# Patient Record
Sex: Male | Born: 1990 | Race: White | Hispanic: No | Smoking: Never smoker
Health system: Southern US, Community
[De-identification: ages and names within clinical notes are randomized; demographics above are authoritative.]

## PROBLEM LIST (undated history)

## (undated) DIAGNOSIS — I341 Nonrheumatic mitral (valve) prolapse: Secondary | ICD-10-CM

## (undated) DIAGNOSIS — J309 Allergic rhinitis, unspecified: Secondary | ICD-10-CM

## (undated) DIAGNOSIS — Z8659 Personal history of other mental and behavioral disorders: Secondary | ICD-10-CM

## (undated) HISTORY — DX: Allergic rhinitis, unspecified: J30.9

## (undated) HISTORY — DX: Personal history of other mental and behavioral disorders: Z86.59

## (undated) HISTORY — DX: Nonrheumatic mitral (valve) prolapse: I34.1

---
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
## 2013-09-28 ENCOUNTER — Emergency Department: Payer: Self-pay | Admitting: Emergency Medicine

## 2014-07-31 ENCOUNTER — Ambulatory Visit: Payer: Self-pay | Admitting: Family Medicine

## 2014-10-01 ENCOUNTER — Encounter (HOSPITAL_COMMUNITY): Payer: Self-pay | Admitting: *Deleted

## 2014-10-01 DIAGNOSIS — M542 Cervicalgia: Secondary | ICD-10-CM | POA: Diagnosis not present

## 2014-10-01 DIAGNOSIS — J9801 Acute bronchospasm: Secondary | ICD-10-CM | POA: Diagnosis not present

## 2014-10-01 DIAGNOSIS — J40 Bronchitis, not specified as acute or chronic: Secondary | ICD-10-CM | POA: Insufficient documentation

## 2014-10-01 DIAGNOSIS — R51 Headache: Secondary | ICD-10-CM | POA: Insufficient documentation

## 2014-10-01 NOTE — ED Notes (Signed)
Pt in c/o headache, cough and congestion, body aches and fatigue, also sore throat, symptoms x1 week

## 2014-10-02 ENCOUNTER — Emergency Department (HOSPITAL_COMMUNITY)
Admission: EM | Admit: 2014-10-02 | Discharge: 2014-10-02 | Disposition: A | Discharge status: Home / Self Care | Payer: BLUE CROSS/BLUE SHIELD | Attending: Emergency Medicine | Admitting: Emergency Medicine

## 2014-10-02 ENCOUNTER — Emergency Department (HOSPITAL_COMMUNITY): Payer: BLUE CROSS/BLUE SHIELD

## 2014-10-02 DIAGNOSIS — R05 Cough: Secondary | ICD-10-CM

## 2014-10-02 DIAGNOSIS — R51 Headache: Secondary | ICD-10-CM

## 2014-10-02 DIAGNOSIS — R519 Headache, unspecified: Secondary | ICD-10-CM

## 2014-10-02 DIAGNOSIS — J209 Acute bronchitis, unspecified: Secondary | ICD-10-CM

## 2014-10-02 DIAGNOSIS — R059 Cough, unspecified: Secondary | ICD-10-CM

## 2014-10-02 MED ORDER — IBUPROFEN 600 MG PO TABS: 600.0000 mg | ORAL_TABLET | Freq: Four times a day (QID) | ORAL | Status: DC | PRN

## 2014-10-02 MED ORDER — PREDNISONE 20 MG PO TABS: ORAL_TABLET | ORAL | Status: DC

## 2014-10-02 MED ORDER — IBUPROFEN 200 MG PO TABS
600.0000 mg | ORAL_TABLET | Freq: Once | ORAL | Status: AC
  Administered 2014-10-02: 600 mg via ORAL
  Filled 2014-10-02: qty 3

## 2014-10-02 MED ORDER — PREDNISONE 20 MG PO TABS
40.0000 mg | ORAL_TABLET | Freq: Once | ORAL | Status: AC
  Administered 2014-10-02: 40 mg via ORAL
  Filled 2014-10-02: qty 2

## 2014-10-02 MED ORDER — ALBUTEROL SULFATE HFA 108 (90 BASE) MCG/ACT IN AERS
1.0000 | INHALATION_SPRAY | RESPIRATORY_TRACT | Status: DC | PRN
  Administered 2014-10-02: 2 via RESPIRATORY_TRACT
  Filled 2014-10-02: qty 6.7

## 2014-10-02 MED ORDER — ALBUTEROL SULFATE (2.5 MG/3ML) 0.083% IN NEBU
5.0000 mg | INHALATION_SOLUTION | Freq: Once | RESPIRATORY_TRACT | Status: AC
  Administered 2014-10-02: 5 mg via RESPIRATORY_TRACT
  Filled 2014-10-02: qty 6

## 2014-10-02 NOTE — Discharge Instructions (Signed)
Bronchospasm A bronchospasm is a spasm or tightening of the airways going into the lungs. During a bronchospasm breathing becomes more difficult because the airways get smaller. When this happens there can be coughing, a whistling sound when breathing (wheezing), and difficulty breathing. Bronchospasm is often associated with asthma, but not all patients who experience a bronchospasm have asthma. CAUSES  A bronchospasm is caused by inflammation or irritation of the airways. The inflammation or irritation may be triggered by:   Allergies (such as to animals, pollen, food, or mold). Allergens that cause bronchospasm may cause wheezing immediately after exposure or many hours later.   Infection. Viral infections are believed to be the most common cause of bronchospasm.   Exercise.   Irritants (such as pollution, cigarette smoke, strong odors, aerosol sprays, and paint fumes).   Weather changes. Winds increase molds and pollens in the air. Rain refreshes the air by washing irritants out. Cold air may cause inflammation.   Stress and emotional upset.  SIGNS AND SYMPTOMS   Wheezing.   Excessive nighttime coughing.   Frequent or severe coughing with a simple cold.   Chest tightness.   Shortness of breath.  DIAGNOSIS  Bronchospasm is usually diagnosed through a history and physical exam. Tests, such as chest X-rays, are sometimes done to look for other conditions. TREATMENT   Inhaled medicines can be given to open up your airways and help you breathe. The medicines can be given using either an inhaler or a nebulizer machine.  Corticosteroid medicines may be given for severe bronchospasm, usually when it is associated with asthma. HOME CARE INSTRUCTIONS   Always have a plan prepared for seeking medical care. Know when to call your health care provider and local emergency services (911 in the [place] Know where you can access local emergency care.  Only take medicines as  directed by your health care provider.  If you were prescribed an inhaler or nebulizer machine, ask your health care provider to explain how to use it correctly. Always use a spacer with your inhaler if you were given one.  It is necessary to remain calm during an attack. Try to relax and breathe more slowly.  Control your home environment in the following ways:   Change your heating and air conditioning filter at least once a month.   Limit your use of fireplaces and wood stoves.  Do not smoke and do not allow smoking in your home.   Avoid exposure to perfumes and fragrances.   Get rid of pests (such as roaches and mice) and their droppings.   Throw away plants if you see mold on them.   Keep your house clean and dust free.   Replace carpet with wood, tile, or vinyl flooring. Carpet can trap dander and dust.   Use allergy-proof pillows, mattress covers, and box spring covers.   Wash bed sheets and blankets every week in hot water and dry them in a dryer.   Use blankets that are made of polyester or cotton.   Wash hands frequently. SEEK MEDICAL CARE IF:   You have muscle aches.   You have chest pain.   The sputum changes from clear or white to yellow, green, gray, or bloody.   The sputum you cough up gets thicker.   There are problems that may be related to the medicine you are given, such as a rash, itching, swelling, or trouble breathing.  SEEK IMMEDIATE MEDICAL CARE IF:   You have worsening wheezing and coughing even   after taking your prescribed medicines.   You have increased difficulty breathing.   You develop severe chest pain. MAKE SURE YOU:   Understand these instructions.  Will watch your condition.  Will get help right away if you are not doing well or get worse. Document Released: 05/25/2003 Document Revised: 05/27/2013 Document Reviewed: 11/11/2012 [employer] Patient Information 2015 [employer]. This information is not  intended to replace advice given to you by your health care provider. Make sure you discuss any questions you have with your health care provider. Acute Bronchitis Bronchitis is inflammation of the airways that extend from the windpipe into the lungs (bronchi). The inflammation often causes mucus to develop. This leads to a cough, which is the most common symptom of bronchitis.  In acute bronchitis, the condition usually develops suddenly and goes away over time, usually in a couple weeks. Smoking, allergies, and asthma can make bronchitis worse. Repeated episodes of bronchitis may cause further lung problems.  CAUSES Acute bronchitis is most often caused by the same virus that causes a cold. The virus can spread from person to person (contagious) through coughing, sneezing, and touching contaminated objects. SIGNS AND SYMPTOMS   Cough.   Fever.   Coughing up mucus.   Body aches.   Chest congestion.   Chills.   Shortness of breath.   Sore throat.  DIAGNOSIS  Acute bronchitis is usually diagnosed through a physical exam. Your health care provider will also ask you questions about your medical history. Tests, such as chest X-rays, are sometimes done to rule out other conditions.  TREATMENT  Acute bronchitis usually goes away in a couple weeks. Oftentimes, no medical treatment is necessary. Medicines are sometimes given for relief of fever or cough. Antibiotic medicines are usually not needed but may be prescribed in certain situations. In some cases, an inhaler may be recommended to help reduce shortness of breath and control the cough. A cool mist vaporizer may also be used to help thin bronchial secretions and make it easier to clear the chest.  HOME CARE INSTRUCTIONS  Get plenty of rest.   Drink enough fluids to keep your urine clear or pale yellow (unless you have a medical condition that requires fluid restriction). Increasing fluids may help thin your respiratory  secretions (sputum) and reduce chest congestion, and it will prevent dehydration.   Take medicines only as directed by your health care provider.  If you were prescribed an antibiotic medicine, finish it all even if you start to feel better.  Avoid smoking and secondhand smoke. Exposure to cigarette smoke or irritating chemicals will make bronchitis worse. If you are a smoker, consider using nicotine gum or skin patches to help control withdrawal symptoms. Quitting smoking will help your lungs heal faster.   Reduce the chances of another bout of acute bronchitis by washing your hands frequently, avoiding people with cold symptoms, and trying not to touch your hands to your mouth, nose, or eyes.   Keep all follow-up visits as directed by your health care provider.  SEEK MEDICAL CARE IF: Your symptoms do not improve after 1 week of treatment.  SEEK IMMEDIATE MEDICAL CARE IF:  You develop an increased fever or chills.   You have chest pain.   You have severe shortness of breath.  You have bloody sputum.   You develop dehydration.  You faint or repeatedly feel like you are going to pass out.  You develop repeated vomiting.  You develop a severe headache. MAKE SURE YOU:     Understand these instructions.  Will watch your condition.  Will get help right away if you are not doing well or get worse. Document Released: 06/29/2004 Document Revised: 10/06/2013 Document Reviewed: 11/12/2012 Exit[employer] Patient Information 2015 [employer]. This information is not intended to replace advice given to you by your health care provider. Make sure you discuss any questions you have with your health care provider.  

## 2014-10-02 NOTE — ED Notes (Signed)
Patient transported to X-ray 

## 2014-10-02 NOTE — ED Provider Notes (Signed)
CSN: [id number]     Arrival date & time 10/01/14  2308 History   First MD Initiated Contact with Patient 10/02/14 0026     This chart was scribed for No att. providers found by Arlan Organ, ED Scribe. This patient was seen in room A02C/A02C and the patient's care was started 12:30 AM.   Chief Complaint  Patient presents with  . Headache   HPI  HPI Comments: Franklin Curry is a 24 y.o. male with a PMHx of seasonal allergies who presents to the Emergency Department complaining of intermittent, ongoing, unchanged HA x 1 week. Pt states HA is located behind is eyes bilaterally and reports pressure that runs down the back on his head. He also reports nonproductive cough, nasal congestion, diffuse myalgias, fatigue, and sore throat. No OTC medications or home remedies attempted prior to arrival. No recent fever or chills. No known sick contacts. No known allergies to medications.  History reviewed. No pertinent past medical history. History reviewed. No pertinent past surgical history. History reviewed. No pertinent family history. History  Substance Use Topics  . Smoking status: Never Smoker   . Smokeless tobacco: Not on file  . Alcohol Use: Not on file    Review of Systems  Constitutional: Positive for fatigue. Negative for fever and chills.  HENT: Positive for congestion, nosebleeds, sinus pressure and sore throat. Negative for ear discharge, ear pain and trouble swallowing.   Respiratory: Positive for cough. Negative for shortness of breath and wheezing.   Cardiovascular: Negative for chest pain.  Gastrointestinal: Negative for nausea, vomiting and abdominal pain.  Musculoskeletal: Positive for myalgias and neck pain. Negative for back pain and neck stiffness.  Skin: Negative for rash and wound.  Neurological: Positive for headaches. Negative for syncope, weakness and numbness.  Psychiatric/Behavioral: Negative for confusion.  All other systems reviewed and are  negative.     Allergies  Review of patient's allergies indicates no known allergies.  Home Medications   Prior to Admission medications   Medication Sig Start Date End Date Taking? Authorizing Provider  ibuprofen (ADVIL,MOTRIN) 600 MG tablet Take 1 tablet (600 mg total) by mouth every 6 (six) hours as needed. 10/02/14   Loren Racer, MD  predniSONE (DELTASONE) 20 MG tablet 3 tabs po day one, then 2 tabs daily x 4 days 10/02/14   Loren Racer, MD   Triage Vitals: BP 113/58 mmHg  Pulse 78  Temp(Src) 98 F (36.7 C) (Oral)  Resp 18  Ht  (1.727 m)  Wt 125 lb (56.7 kg)  BMI 19.01 kg/m2  SpO2 100%   Physical Exam  Constitutional: He is oriented to person, place, and time. He appears well-developed and well-nourished. No distress.  HENT:  Head: Normocephalic and atraumatic.  Mouth/Throat: Oropharynx is clear and moist. No oropharyngeal exudate.  Oropharynx mildly erythematous. No tonsillar exudates. Uvula is midline. Bilateral nasal mucosal edema. No sinus tenderness with percussion. Small mobile occipital lymph node palpated.  Eyes: EOM are normal. Pupils are equal, round, and reactive to light. Right eye exhibits no discharge. Left eye exhibits no discharge.  Neck: Normal range of motion. Neck supple.  No meningismus  Cardiovascular: Normal rate and regular rhythm.  Exam reveals no gallop and no friction rub.   No murmur heard. Pulmonary/Chest: Effort normal. No respiratory distress. He has no wheezes. He has no rales.  Decreased breath sounds bilateral bases.  Abdominal: Soft. Bowel sounds are normal. He exhibits no distension and no mass. There is no tenderness. There is no  rebound and no guarding.  Musculoskeletal: Normal range of motion. He exhibits no edema or tenderness.  No calf swelling or tenderness. Distal pulses intact.  Lymphadenopathy:    He has no cervical adenopathy.  Neurological: He is alert and oriented to person, place, and time.  Moves all  extremities without deficit. Sensation is grossly intact.  Skin: Skin is warm and dry. No rash noted. No erythema.  Psychiatric: He has a normal mood and affect. His behavior is normal.  Nursing note and vitals reviewed.   ED Course  Procedures (including critical care time)  DIAGNOSTIC STUDIES: Oxygen Saturation is 99% on RA, Normal by my interpretation.    COORDINATION OF CARE: 12:33 AM-Discussed treatment plan with pt at bedside and pt agreed to plan.     Labs Review Labs Reviewed - No data to display  Imaging Review Dg Chest 2 View  10/02/2014   CLINICAL DATA:  Acute onset of cough, shortness of breath and chest pain. Initial encounter.  EXAM: CHEST  2 VIEW  COMPARISON:  None.  FINDINGS: The lungs are well-aerated. Mild peribronchial thickening is noted. There is no evidence of focal opacification, pleural effusion or pneumothorax.  The heart is normal in size; the mediastinal contour is within normal limits. No acute osseous abnormalities are seen.  IMPRESSION: Mild peribronchial thickening noted; lungs otherwise clear.   Electronically Signed   By: Roanna RaiderJeffery  Chang M.D.   On: 10/02/2014 01:45     EKG Interpretation None      MDM   Final diagnoses:  Cough  Bronchitis with bronchospasm  Nonintractable headache, unspecified chronicity pattern, unspecified headache type    I personally performed the services described in this documentation, which was scribed in my presence. The recorded information has been reviewed and is accurate.  Patient's much better after albuterol treatment. Bronchitic changes on x-ray. Patient a normal neurologic exam. Do not suspect intracranial pathology. Likely bronchitis with bronchospasm. Headache may be related to sinus pressure and cough. Advised to continue Allegra and nasal sprays. Return precautions have been given.  Loren Raceravid Trea Carnegie, MD 10/02/14 [phone]

## 2014-11-06 ENCOUNTER — Ambulatory Visit: Payer: Self-pay | Admitting: Family Medicine

## 2015-02-26 ENCOUNTER — Encounter: Payer: Self-pay | Admitting: Family Medicine

## 2015-02-26 ENCOUNTER — Encounter (INDEPENDENT_AMBULATORY_CARE_PROVIDER_SITE_OTHER): Payer: Self-pay

## 2015-02-26 ENCOUNTER — Ambulatory Visit (INDEPENDENT_AMBULATORY_CARE_PROVIDER_SITE_OTHER): Payer: BLUE CROSS/BLUE SHIELD | Admitting: Family Medicine

## 2015-02-26 VITALS — BP 100/60 | HR 68 | Temp 97.0°F | Resp 16 | Ht 68.0 in | Wt 122.0 lb

## 2015-02-26 DIAGNOSIS — Z0000 Encounter for general adult medical examination without abnormal findings: Secondary | ICD-10-CM | POA: Diagnosis not present

## 2015-02-26 DIAGNOSIS — Z23 Encounter for immunization: Secondary | ICD-10-CM | POA: Diagnosis not present

## 2015-02-26 DIAGNOSIS — B079 Viral wart, unspecified: Secondary | ICD-10-CM

## 2015-02-26 DIAGNOSIS — B078 Other viral warts: Secondary | ICD-10-CM | POA: Insufficient documentation

## 2015-02-26 NOTE — Progress Notes (Signed)
Pre visit review using our clinic review tool, if applicable. No additional management support is needed unless otherwise documented below in the visit note. 

## 2015-02-26 NOTE — Patient Instructions (Addendum)
Flu shot today We will freeze warts on hand. For one near nail fold - treat with over the counter salicylic acid pad (like clear away wart remover). Follow instructions on packet. Nice to meet you today, return as needed or in 1 year for next physical.   Health Maintenance - 97-87 Years [place] After high school, you may attend college or technical or vocational school, enroll in the [employer], or enter the workforce. PHYSICAL, SOCIAL, AND EMOTIONAL DEVELOPMENT  One hour of regular physical activity daily is recommended. Continue to participate in sports.  Develop your own interests and consider community service or volunteerism.  Make decisions about college and work plans.  Throughout these years, you should assume responsibility for your own health care. Increasing independence is important for you.  You may be exploring your sexual identity. Understand that you should never be in a situation that makes you feel uncomfortable, and tell your partner if you do not want to engage in sexual activity.  Body image may become important to you. Be mindful that eating disorders can develop at this time. Talk to your parents or other caregivers if you have concerns about body image, weight gain, or losing weight.  You may notice mood disturbances, depression, anxiety, attention problems, or trouble with alcohol. Talk to your health care provider if you have concerns about mental illness.  Set limits for yourself and talk with your parents or other caregivers about independent decision making.  Handle conflict without physical violence.  Avoid loud noises which may impair hearing.  Limit television and computer time to 2 hours each day. Individuals who engage in excessive inactivity are more likely to become overweight. RECOMMENDED IMMUNIZATIONS  Influenza vaccine.  All adults should be immunized every year.  All adults, including pregnant women and people with hives-only  allergy to eggs, can receive the inactivated influenza (IIV) vaccine.  Adults aged 18-49 years can receive the recombinant influenza (RIV) vaccine. The RIV vaccine does not contain any egg protein.  Tetanus, diphtheria, and acellular pertussis (Td, Tdap) vaccine.  Pregnant women should receive 1 dose of Tdap vaccine during each pregnancy. The dose should be obtained regardless of the length of time since the last dose. Immunization is preferred during the 27th to 36th week of gestation.  An adult who has not previously received Tdap or who does not know his or her vaccine status should receive 1 dose of Tdap. This initial dose should be followed by tetanus and diphtheria toxoids (Td) booster doses every 10 years.  Adults with an unknown or incomplete history of completing a 3-dose immunization series with Td-containing vaccines should begin or complete a primary immunization series including a Tdap dose.  Adults should receive a Td booster every 10 years.  Varicella vaccine.  An adult without evidence of immunity to varicella should receive 2 doses or a second dose if he or she has previously received 1 dose.  Pregnant females who do not have evidence of immunity should receive the first dose after pregnancy. This first dose should be obtained before leaving the health care facility. The second dose should be obtained 4-8 weeks after the first dose.  Human papillomavirus (HPV) vaccine.  Females aged 13-26 years who have not received the vaccine previously should obtain the 3-dose series.  The vaccine is not recommended for pregnant females. However, pregnancy testing is not needed before receiving a dose. If a male is found to be pregnant after receiving a dose, no treatment is needed.  In that case, the remaining doses should be delayed until after the pregnancy.  Males aged 29-21 years who have not received the vaccine previously should receive the 3-dose series. Males aged 22-26 years  may be immunized.  Immunization is recommended through the age of 71 years for any male who has sex with males and did not get any or all doses earlier.  Immunization is recommended for any person with an immunocompromised condition through the age of 51 years if he or she did not get any or all doses earlier.  During the 3-dose series, the second dose should be obtained 4-8 weeks after the first dose. The third dose should be obtained 24 weeks after the first dose and 16 weeks after the second dose.  Measles, mumps, and rubella (MMR) vaccine.  Adults born in [birth year] or later should have 1 or more doses of MMR vaccine unless there is a contraindication to the vaccine or there is laboratory evidence of immunity to each of the three diseases.  A routine second dose of MMR vaccine should be obtained at least 28 days after the first dose for students attending postsecondary schools, health care workers, and international travelers.  For females of childbearing age, rubella immunity should be determined. If there is no evidence of immunity, females who are not pregnant should be vaccinated. If there is no evidence of immunity, females who are pregnant should delay immunization until after pregnancy.  Pneumococcal 13-valent conjugate (PCV13) vaccine.  When indicated, a person who is uncertain of his or her immunization history and has no record of immunization should receive the PCV13 vaccine.  An adult aged 49 years or older who has certain medical conditions and has not been previously immunized should receive 1 dose of PCV13 vaccine. This PCV13 should be followed with a dose of pneumococcal polysaccharide (PPSV23) vaccine. The PPSV23 vaccine dose should be obtained at least 8 weeks after the dose of PCV13 vaccine.  An adult aged 30 years or older who has certain medical conditions and previously received 1 or more doses of PPSV23 vaccine should receive 1 dose of PCV13. The PCV13 vaccine dose should  be obtained 1 or more years after the last PPSV23 vaccine dose.  Pneumococcal polysaccharide (PPSV23) vaccine.  When PCV13 is also indicated, PCV13 should be obtained first.  An adult younger than age 81 years who has certain medical conditions should be immunized.  Any person who resides in a long-term care facility should be immunized.  An adult smoker should be immunized.  People with an immunocompromised condition and certain other conditions should receive both PCV13 and PPSV23 vaccines.  People with human immunodeficiency virus (HIV) infection should be immunized as soon as possible after diagnosis.  Immunization during chemotherapy or radiation therapy should be avoided.  Routine use of PPSV23 vaccine is not recommended for American Indians, [place] Natives, or people younger than 65 years unless there are medical conditions that require PPSV23 vaccine.  When indicated, people who have unknown immunization and have no record of immunization should receive PPSV23 vaccine.  One-time revaccination 5 years after the first dose of PPSV23 is recommended for people aged 19-64 years who have chronic kidney failure, nephrotic syndrome, asplenia, or immunocompromised conditions.  Meningococcal vaccine.  Adults with asplenia or persistent complement component deficiencies should receive 2 doses of quadrivalent meningococcal conjugate (MenACWY-D) vaccine. The doses should be obtained at least 2 months apart.  Microbiologists working with certain meningococcal bacteria, [employer] recruits, people at risk during an outbreak,  and people who travel to or live in countries with a high rate of meningitis should be immunized.  A first-year college student up through age 71 years who is living in a residence hall should receive a dose if he or she did not receive a dose on or after his or her 16th birthday.  Adults who have certain high-risk conditions should receive one or more doses of  vaccine.  Hepatitis A vaccine.  Adults who wish to be protected from this disease, have certain high-risk conditions, work with hepatitis A-infected animals, work in hepatitis A research labs, or travel to or work in countries with a high rate of hepatitis A should be immunized.  Adults who were previously unvaccinated and who anticipate close contact with an international adoptee during the first 60 days after arrival in the [place] from a country with a high rate of hepatitis A should be immunized.  Hepatitis B vaccine.  Adults who wish to be protected from this disease, have certain high-risk conditions, may be exposed to blood or other infectious body fluids, are household contacts or sex partners of hepatitis B positive people, are clients or workers in certain care facilities, or travel to or work in countries with a high rate of hepatitis B should be immunized.  Haemophilus influenzae type b (Hib) vaccine.  A previously unvaccinated person with asplenia or sickle cell disease or having a scheduled splenectomy should receive 1 dose of Hib vaccine.  Regardless of previous immunization, a recipient of a hematopoietic stem cell transplant should receive a 3-dose series 6-12 months after his or her successful transplant.  Hib vaccine is not recommended for adults with HIV infection. TESTING  Annual screening for vision and hearing problems is recommended. Vision should be screened at least once between 33-38 years of age.  You may be screened for anemia or tuberculosis.  You should have a blood test to check for high cholesterol.  You should be screened for alcohol and drug use.  If you are sexually active, you may be screened for sexually transmitted infections (STIs), pregnancy, or HIV. You should be screened for STIs if:  Your sexual activity has changed since the last screening test, and you are at an increased risk for chlamydia or gonorrhea. Ask your health care provider  if you are at risk.  If you are at an increased risk for hepatitis B, you should be screened for this virus. You are considered at high risk for hepatitis B if you:  Were born in a country where hepatitis B occurs often. Talk with your health care provider about which countries are considered high risk.  Have parents who were born in a high-risk country and have not received a shot to protect against hepatitis B (hepatitis B vaccine).  Have HIV or AIDS.  Use needles to inject street drugs.  Live with or have sex with someone who has hepatitis B.  Are a man who has sex with other men (MSM).  Get hemodialysis treatment.  Take certain medicines for conditions like cancer, organ transplantation, or autoimmune conditions. NUTRITION   You should:  Have three servings of low-fat milk and dairy products daily. If you do not drink milk or consume dairy products, you should eat calcium-enriched foods, such as juice, bread, or cereal. Dark, leafy greens or canned fish are alternate sources of calcium.  Drink plenty of water. Fruit juice should be limited to 8-12 oz (240-360 mL) each day. Sugary beverages and sodas should be  avoided.  Avoid eating foods high in fat, salt, or sugar, such as chips, candy, and cookies.  Avoid fast foods and limit eating out at restaurants.  Try not to skip meals, especially breakfast. You should eat a variety of vegetables, fruits, and lean meats.  Eat meals together as a family whenever possible. ORAL HEALTH Brush your teeth twice a day and floss at least once a day. You should have two dental exams a year.  SKIN CARE You should wear sunscreen when out in the sun. TALK TO SOMEONE ABOUT:  Precautions against pregnancy, contraception, and sexually transmitted infections.  Taking a prescription medicine daily to prevent HIV infection if you are at risk of being infected with HIV. This is called preexposure prophylaxis (PrEP). You are at risk if you:  Are a  male who has sex with other males (MSM).  Are heterosexual and sexually active with more than one partner.  Take drugs by injection.  Are sexually active with a partner who has HIV.  Whether you are at high risk of being infected with HIV. If you choose to begin PrEP, you should first be tested for HIV. You should then be tested every 3 months for as long as you are taking PrEP.  Drug, tobacco, and alcohol use among your friends or at friends' homes. Smoking tobacco or marijuana and taking drugs have health consequences and may impact your brain development.  Appropriate use of over-the-counter or prescription medicines.  Driving guidelines and riding with friends.  The risks of drinking and driving or boating. Call someone if you have been drinking or using drugs and need a ride. WHAT'S NEXT? Visit your pediatrician or family physician once a year. By young adulthood, you should transition from your pediatrician to a family physician or internal medicine specialist. If you are a male and are sexually active, you may want to begin annual physical exams with a gynecologist. Document Released: 08/17/2006 Document Revised: 05/27/2013 Document Reviewed: 09/06/2006 [facility] Patient Information 2015 [place], [place]. This information is not intended to replace advice given to you by your health care provider. Make sure you discuss any questions you have with your health care provider.

## 2015-02-26 NOTE — Progress Notes (Signed)
BP 100/60 mmHg  Pulse 68  Temp(Src) 97 F (36.1 C)  Resp 16  Ht  (1.727 m)  Wt 122 lb (55.339 kg)  BMI 18.55 kg/m2  SpO2 97%   CC: new pt to establish  Subjective:    Patient ID: Franklin Curry, male    DOB: [birth year], 24 y.o.   MRN: [id number]  HPI: Franklin Curry is a 24 y.o. male presenting on 02/26/2015 for New Patient (Initial Visit)   Would like warts on hand evaluated. Started as callus. Ongoing for 6-7 months.   Sexually active - 1 partner in last year. Condoms 100%. No STD concerns.  Preventative: Flu shot today Tetanus shot - 12/2007 Seat belt use discussed.  Sunscreen use discussed. No changing moles on skin.   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 Interim medical history since our last visit reviewed. Allergies and medications reviewed and updated. No current outpatient prescriptions on file prior to visit.   No current facility-administered medications on file prior to visit.    Review of Systems  Constitutional: Negative for fever, chills, activity change, appetite change, fatigue and unexpected weight change.  HENT: Negative for hearing loss.   Eyes: Negative for visual disturbance.  Respiratory: Negative for cough, chest tightness, shortness of breath and wheezing.   Cardiovascular: Negative for chest pain, palpitations and leg swelling.  Gastrointestinal: Negative for nausea, vomiting, abdominal pain, diarrhea, constipation, blood in stool and abdominal distention.  Genitourinary: Negative for hematuria and difficulty urinating.  Musculoskeletal: Negative for myalgias, arthralgias and neck pain.  Skin: Negative for rash.  Neurological: Negative for dizziness, seizures, syncope and headaches.    Hematological: Negative for adenopathy. Does not bruise/bleed easily.  Psychiatric/Behavioral: Negative for dysphoric mood. The patient is not nervous/anxious.    Per HPI unless specifically indicated above     Objective:    BP 100/60 mmHg  Pulse 68  Temp(Src) 97 F (36.1 C)  Resp 16  Ht  (1.727 m)  Wt 122 lb (55.339 kg)  BMI 18.55 kg/m2  SpO2 97%  Wt Readings from Last 3 Encounters:  02/26/15 122 lb (55.339 kg)  10/01/14 125 lb (56.7 kg)    Physical Exam  Constitutional: He is oriented to person, place, and time. He appears well-developed and well-nourished. No distress.  HENT:  Head: Normocephalic and atraumatic.  Right Ear: Hearing, tympanic membrane, external ear and ear canal normal.  Left Ear: Hearing, tympanic membrane, external ear and ear canal normal.  Nose: Nose normal.  Mouth/Throat: Uvula is midline, oropharynx is clear and moist and mucous membranes are normal. No oropharyngeal exudate, posterior oropharyngeal edema or posterior oropharyngeal erythema.  Eyes: Conjunctivae and EOM are normal. Pupils are equal, round, and reactive to light. No scleral icterus.  Neck: Normal range of motion. Neck supple. No thyromegaly present.  Cardiovascular: Normal rate, regular rhythm, normal heart sounds and intact distal pulses.   No murmur heard. Pulses:      Radial pulses are 2+ on the right side, and 2+ on the left side.  Pulmonary/Chest: Effort normal and breath sounds normal. No respiratory distress. He has no wheezes. He has no rales.  Abdominal: Soft. Bowel sounds are normal. He exhibits no distension and no mass. There is no tenderness. There is no rebound and no guarding.  Musculoskeletal:  Normal range of motion. He exhibits no edema.  Lymphadenopathy:    He has no cervical adenopathy.  Neurological: He is alert and oriented to person, place, and time.  CN grossly intact, station and gait intact  Skin: Skin is warm and dry. No rash noted.  Verrucous growths  on R thumb (one on medial nail fold)  Psychiatric: He has a normal mood and affect. His behavior is normal. Judgment and thought content normal.  Nursing note and vitals reviewed.  Cryotherapy: Liquid nitrogen was applied for 10-12 seconds to the skin lesion and the expected blistering or scabbing reaction explained. Do not pick at the area. Return if lesion fails to fully resolve.  No results found for this or any previous visit.    Assessment & Plan:   Problem List Items Addressed This Visit    Verruca vulgaris    Treat with LN2 to 2 warts on thumb fold. Discussed expected course of healing Discussed OTC salicylic acid treatment.  Update if not improving with treatment - discussed possibly needing to return for rpt treatment.      Health maintenance examination - Primary    Preventative protocols reviewed and updated unless pt declined. Discussed healthy diet and lifestyle.           Follow up plan: Return in about 1 year (around 02/26/2016), or as needed, for annual exam, prior fasting for blood work.

## 2015-02-26 NOTE — Assessment & Plan Note (Signed)
Preventative protocols reviewed and updated unless pt declined. Discussed healthy diet and lifestyle.  

## 2015-02-26 NOTE — Assessment & Plan Note (Signed)
Treat with LN2 to 2 warts on thumb fold. Discussed expected course of healing Discussed OTC salicylic acid treatment.  Update if not improving with treatment - discussed possibly needing to return for rpt treatment.

## 2015-03-06 ENCOUNTER — Encounter: Payer: Self-pay | Admitting: Family Medicine

## 2015-03-08 ENCOUNTER — Encounter: Payer: Self-pay | Admitting: Family Medicine

## 2015-06-14 ENCOUNTER — Ambulatory Visit (INDEPENDENT_AMBULATORY_CARE_PROVIDER_SITE_OTHER): Payer: No Typology Code available for payment source | Admitting: Internal Medicine

## 2015-06-14 ENCOUNTER — Encounter: Payer: Self-pay | Admitting: Internal Medicine

## 2015-06-14 VITALS — BP 110/66 | HR 86 | Temp 99.0°F | Wt 125.0 lb

## 2015-06-14 DIAGNOSIS — J069 Acute upper respiratory infection, unspecified: Secondary | ICD-10-CM | POA: Diagnosis not present

## 2015-06-14 MED ORDER — AZITHROMYCIN 250 MG PO TABS: ORAL_TABLET | ORAL | Status: DC

## 2015-06-14 NOTE — Progress Notes (Signed)
HPI  Pt presents to the clinic today with c/o nasal congestion, ear fullness, sore throat and cough. This started 1 week ago. He is blowing green mucous out of his nose. The cough is productive of green mucous. He denies fever, but has had chills and body aches. He denies shortness of breath. He has tried Benadryl and Robitussin with some relief. He has a history of seasonal allergies. He has had sick contacts. He does not smoke.  Review of Systems      Past Medical History  Diagnosis Date  . Allergic rhinitis   . History of attention deficit disorder     prior on adderall and vyvanse, now off all meds; neuro eval from 2004 scanned into chart  . MVP (mitral valve prolapse) 2006    minor    Family History  Problem Relation Age of Onset  . Arthritis Maternal Grandmother   . Arthritis Maternal Grandfather   . Diabetes Maternal Grandfather   . Cancer Other     both grandmothers, unsure type  . CAD Neg Hx   . Stroke Neg Hx     Social History   Social History  . Marital Status: Unknown    Spouse Name: N/A  . Number of Children: N/A  . Years of Education: N/A   Occupational History  . Not on file.   Social History Main Topics  . Smoking status: Never Smoker   . Smokeless tobacco: Current User    Types: Snuff     Comment: 1 can/wk  . Alcohol Use: 0.0 oz/week    0 Standard drinks or equivalent per week     Comment: occasional  . Drug Use: No  . Sexual Activity: Not on file   Other Topics Concern  . Not on file   Social History Narrative   Lives with parents, and in firehouse   Occupation: Passenger transport manager at [employer], Engineer, water   Edu: HS   Activity: active at work   Diet: good water, no fruits/vegetables     No Known Allergies   Constitutional:  Denies headache, fatigue, fever or abrupt weight changes.  HEENT:  Positive nasal congestion, ear pain and  sore throat. Denies eye redness, eye pain, pressure behind the eyes, facial pain,  ringing in the ears, wax buildup, runny nose or bloody nose. Respiratory: Positive cough. Denies difficulty breathing or shortness of breath.  Cardiovascular: Denies chest pain, chest tightness, palpitations or swelling in the hands or feet.   No other specific complaints in a complete review of systems (except as listed in HPI above).  Objective:   BP 110/66 mmHg  Pulse 86  Temp(Src) 99 F (37.2 C) (Oral)  Wt 125 lb (56.7 kg)  SpO2 98% Wt Readings from Last 3 Encounters:  06/14/15 125 lb (56.7 kg)  02/26/15 122 lb (55.339 kg)  10/01/14 125 lb (56.7 kg)     General: Appears his stated age, will appearing in NAD. HEENT: Head: normal shape and size, no sinus tenderness noted; Eyes: sclera white, no icterus, conjunctiva pink; Ears: Tm's pink but intact, normal light reflex; Nose: mucosa pink and moist, septum midline; Throat/Mouth: + PND. Teeth present, mucosa erythematous and moist, no exudate noted, no lesions or ulcerations noted.  Neck: Cervical lymphadenopathy noted.  Cardiovascular: Normal rate and rhythm. S1,S2 noted.  No murmur, rubs or gallops noted.  Pulmonary/Chest: Normal effort and positive vesicular breath sounds. No respiratory distress. No wheezes, rales or ronchi noted.      Assessment &  Plan:   Upper Respiratory Infection:  Get some rest and drink plenty of water Do salt water gargles for the sore throat eRx for Azithromax x 5 days Continue Robitussin for cough Work note provided  RTC as needed or if symptoms persist.

## 2015-06-14 NOTE — Progress Notes (Signed)
Pre visit review using our clinic review tool, if applicable. No additional management support is needed unless otherwise documented below in the visit note. 

## 2015-06-14 NOTE — Patient Instructions (Signed)
Upper Respiratory Infection, Adult Most upper respiratory infections (URIs) are a viral infection of the air passages leading to the lungs. A URI affects the nose, throat, and upper air passages. The most common type of URI is nasopharyngitis and is typically referred to as "the common cold." URIs run their course and usually go away on their own. Most of the time, a URI does not require medical attention, but sometimes a bacterial infection in the upper airways can follow a viral infection. This is called a secondary infection. Sinus and middle ear infections are common types of secondary upper respiratory infections. Bacterial pneumonia can also complicate a URI. A URI can worsen asthma and chronic obstructive pulmonary disease (COPD). Sometimes, these complications can require emergency medical care and may be life threatening.  CAUSES Almost all URIs are caused by viruses. A virus is a type of germ and can spread from one person to another.  RISKS FACTORS You may be at risk for a URI if:   You smoke.   You have chronic heart or lung disease.  You have a weakened defense (immune) system.   You are very young or very old.   You have nasal allergies or asthma.  You work in crowded or poorly ventilated areas.  You work in health care facilities or schools. SIGNS AND SYMPTOMS  Symptoms typically develop 2-3 days after you come in contact with a cold virus. Most viral URIs last 7-10 days. However, viral URIs from the influenza virus (flu virus) can last 14-18 days and are typically more severe. Symptoms may include:   Runny or stuffy (congested) nose.   Sneezing.   Cough.   Sore throat.   Headache.   Fatigue.   Fever.   Loss of appetite.   Pain in your forehead, behind your eyes, and over your cheekbones (sinus pain).  Muscle aches.  DIAGNOSIS  Your health care provider may diagnose a URI by:  Physical exam.  Tests to check that your symptoms are not due to  another condition such as:  Strep throat.  Sinusitis.  Pneumonia.  Asthma. TREATMENT  A URI goes away on its own with time. It cannot be cured with medicines, but medicines may be prescribed or recommended to relieve symptoms. Medicines may help:  Reduce your fever.  Reduce your cough.  Relieve nasal congestion. HOME CARE INSTRUCTIONS   Take medicines only as directed by your health care provider.   Gargle warm saltwater or take cough drops to comfort your throat as directed by your health care provider.  Use a warm mist humidifier or inhale steam from a shower to increase air moisture. This may make it easier to breathe.  Drink enough fluid to keep your urine clear or pale yellow.   Eat soups and other clear broths and maintain good nutrition.   Rest as needed.   Return to work when your temperature has returned to normal or as your health care provider advises. You may need to stay home longer to avoid infecting others. You can also use a face mask and careful hand washing to prevent spread of the virus.  Increase the usage of your inhaler if you have asthma.   Do not use any tobacco products, including cigarettes, chewing tobacco, or electronic cigarettes. If you need help quitting, ask your health care provider. PREVENTION  The best way to protect yourself from getting a cold is to practice good hygiene.   Avoid oral or hand contact with people with cold   symptoms.   Wash your hands often if contact occurs.  There is no clear evidence that vitamin C, vitamin E, echinacea, or exercise reduces the chance of developing a cold. However, it is always recommended to get plenty of rest, exercise, and practice good nutrition.  SEEK MEDICAL CARE IF:   You are getting worse rather than better.   Your symptoms are not controlled by medicine.   You have chills.  You have worsening shortness of breath.  You have brown or red mucus.  You have yellow or brown nasal  discharge.  You have pain in your face, especially when you bend forward.  You have a fever.  You have swollen neck glands.  You have pain while swallowing.  You have white areas in the back of your throat. SEEK IMMEDIATE MEDICAL CARE IF:   You have severe or persistent:  Headache.  Ear pain.  Sinus pain.  Chest pain.  You have chronic lung disease and any of the following:  Wheezing.  Prolonged cough.  Coughing up blood.  A change in your usual mucus.  You have a stiff neck.  You have changes in your:  Vision.  Hearing.  Thinking.  Mood. MAKE SURE YOU:   Understand these instructions.  Will watch your condition.  Will get help right away if you are not doing well or get worse.   This information is not intended to replace advice given to you by your health care provider. Make sure you discuss any questions you have with your health care provider.   Document Released: 11/15/2000 Document Revised: 10/06/2014 Document Reviewed: 08/27/2013 Elsevier Interactive Patient Education 2016 Elsevier Inc.  

## 2015-08-22 IMAGING — DX DG CHEST 2V
2 series · 2 of 2 positions shown · non-contrast
Comparison: None.

CLINICAL DATA: Acute onset of cough, shortness of breath and chest
pain. Initial encounter.

EXAM:
CHEST  2 VIEW

[chest pa]
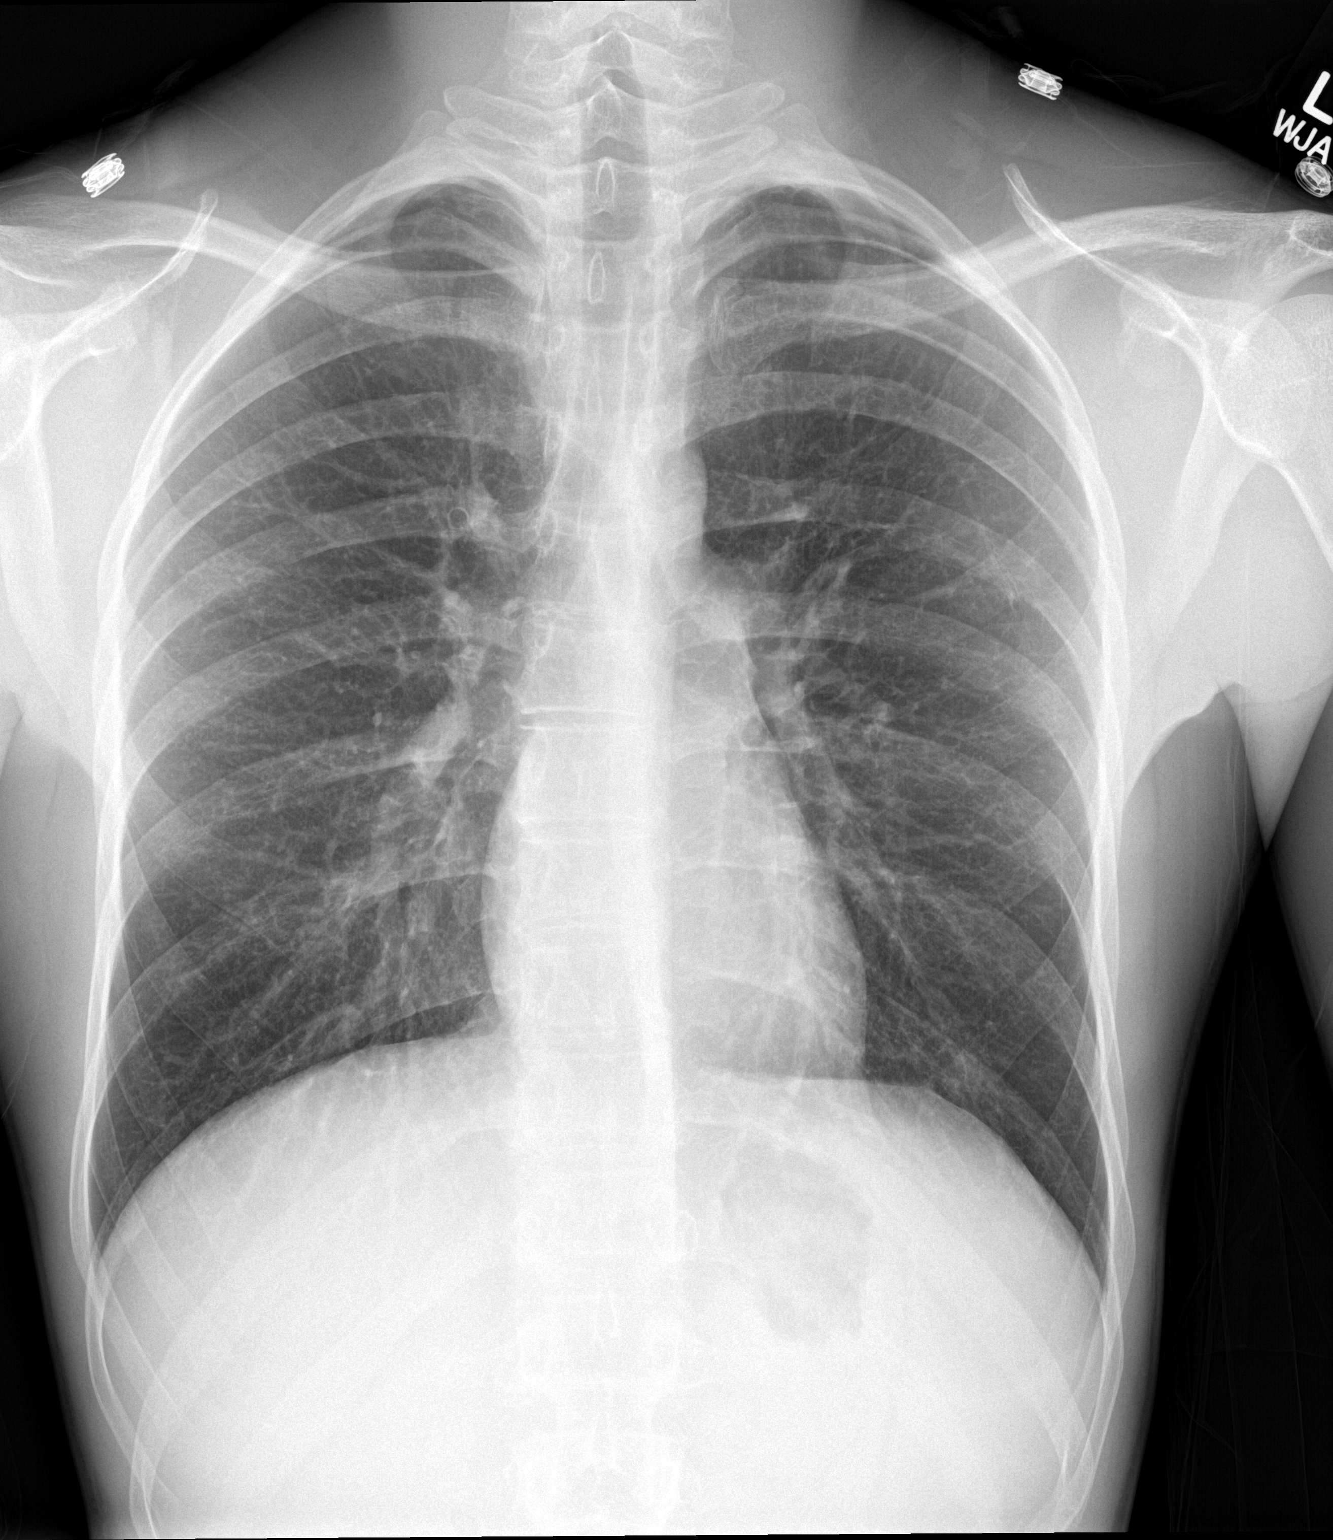

[chest lat]
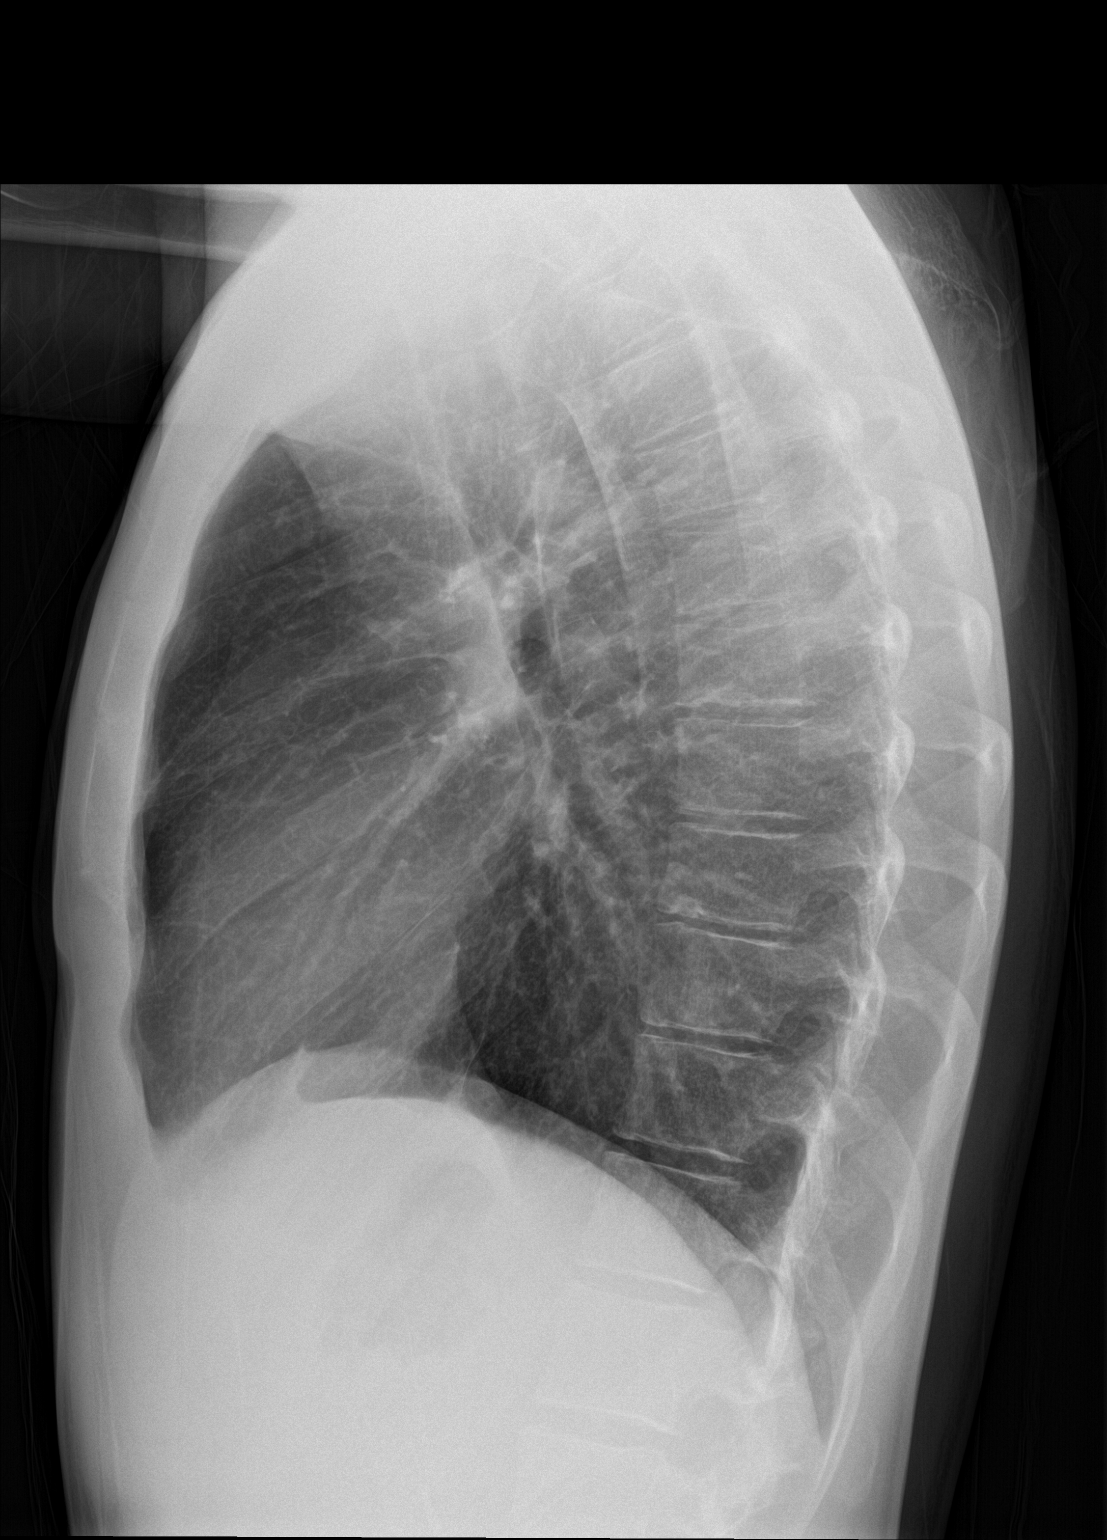

[2 of 2 positions shown; findings below may reference images not displayed]

FINDINGS: The lungs are well-aerated. Mild peribronchial thickening is noted.
There is no evidence of focal opacification, pleural effusion or
pneumothorax.

The heart is normal in size; the mediastinal contour is within
normal limits. No acute osseous abnormalities are seen.
IMPRESSION: Mild peribronchial thickening noted; lungs otherwise clear.

## 2015-10-20 ENCOUNTER — Encounter: Payer: Self-pay | Admitting: Family Medicine

## 2015-10-20 ENCOUNTER — Ambulatory Visit: Payer: No Typology Code available for payment source | Admitting: Internal Medicine

## 2015-10-20 ENCOUNTER — Ambulatory Visit (INDEPENDENT_AMBULATORY_CARE_PROVIDER_SITE_OTHER): Payer: No Typology Code available for payment source | Admitting: Family Medicine

## 2015-10-20 VITALS — BP 117/72 | HR 104 | Temp 99.2°F | Ht 68.0 in | Wt 125.8 lb

## 2015-10-20 DIAGNOSIS — J029 Acute pharyngitis, unspecified: Secondary | ICD-10-CM

## 2015-10-20 LAB — POCT RAPID STREP A (OFFICE): RAPID STREP A SCREEN: NEGATIVE

## 2015-10-20 MED ORDER — AMOXICILLIN-POT CLAVULANATE 875-125 MG PO TABS: 1.0000 | ORAL_TABLET | Freq: Two times a day (BID) | ORAL | Status: DC

## 2015-10-20 NOTE — Progress Notes (Signed)
Dr. Karleen Hampshire T. Khan Chura, MD, CAQ [facility] [address] [place], [address] Phone: [phone] Fax: [phone]  10/20/2015  Patient: Franklin Curry, MRN: [id number], DOB: 02/04/1991, 25 y.o.  Primary Physician:  Eustaquio Boyden, MD   Chief Complaint  Patient presents with  . Sore Throat  . Nasal Congestion  . Head Pressure  . Back Pain    Low   Subjective:   Franklin Curry is a 25 y.o. very pleasant male patient who presents with the following:  Very nice gentleman with sinus pressure, congestion, headache, significant sore throat, some back pain, and coughing.  Overall he generally does not feel all that well.  At baseline he does have some allergies.  Sore throat, sinus pressure behind eyes. Some back pain.  Just started yesterday.   Past Medical History, Surgical History, Social History, Family History, Problem List, Medications, and Allergies have been reviewed and updated if relevant.  Patient Active Problem List   Diagnosis Date Noted  . Health maintenance examination 02/26/2015  . Verruca vulgaris 02/26/2015    Past Medical History  Diagnosis Date  . Allergic rhinitis   . History of attention deficit disorder     prior on adderall and vyvanse, now off all meds; neuro eval from 2004 scanned into chart  . MVP (mitral valve prolapse) 2006    minor    No past surgical history on file.  Social History   Social History  . Marital Status: Unknown    Spouse Name: N/A  . Number of Children: N/A  . Years of Education: N/A   Occupational History  . Not on file.   Social History Main Topics  . Smoking status: Never Smoker   . Smokeless tobacco: Current User    Types: Snuff     Comment: 1 can/wk  . Alcohol Use: 0.0 oz/week    0 Standard drinks or equivalent per week     Comment: occasional  . Drug Use: No  . Sexual Activity: Not on file   Other Topics Concern  . Not on file   Social History  Narrative   Lives with parents, and in firehouse   Occupation: Passenger transport manager at [employer], Engineer, water   Edu: HS   Activity: active at work   Diet: good water, no fruits/vegetables     Family History  Problem Relation Age of Onset  . Arthritis Maternal Grandmother   . Arthritis Maternal Grandfather   . Diabetes Maternal Grandfather   . Cancer Other     both grandmothers, unsure type  . CAD Neg Hx   . Stroke Neg Hx     No Known Allergies  Medication list reviewed and updated in full in [facility].  ROS: GEN: Acute illness details above GI: Tolerating PO intake GU: maintaining adequate hydration and urination Pulm: No SOB Interactive and getting along well at home.  Otherwise, ROS is as per the HPI.  Objective:   BP 117/72 mmHg  Pulse 104  Temp(Src) 99.2 F (37.3 C) (Oral)  Ht  (1.727 m)  Wt 125 lb 12 oz (57.04 kg)  BMI 19.12 kg/m2   Gen: WDWN, NAD; A & O x3, cooperative. Pleasant.Globally Non-toxic HEENT: Normocephalic and atraumatic. Throat clear, w/o exudate, R TM clear, L TM - good landmarks, No fluid present. rhinnorhea.  MMM Frontal sinuses: NT Max sinuses: NT NECK: Anterior cervical  LAD is absent CV: RRR, No M/G/R, cap refill <2 sec  PULM: Breathing comfortably in no respiratory distress. no wheezing, crackles, rhonchi EXT: No c/c/e PSYCH: Friendly, good eye contact MSK: Nml gait     Laboratory and Imaging Data: Results for orders placed or performed in visit on 10/20/15  POCT rapid strep A  Result Value Ref Range   Rapid Strep A Screen Negative Negative     Assessment and Plan:   Viral pharyngitis  Sore throat - Plan: POCT rapid strep A  More likely viral.  Recommended that he take some Sudafed and give this several more days.  He also has allergies at baseline, so this could be an early sinus infection.  I gave him a paper prescription of Augmentin to hold.  Follow-up: No Follow-up on file.  New  Prescriptions   AMOXICILLIN-CLAVULANATE (AUGMENTIN) 875-125 MG TABLET    Take 1 tablet by mouth 2 (two) times daily.   Modified Medications   No medications on file   Orders Placed This Encounter  Procedures  . POCT rapid strep A    Signed,  Crystall Donaldson T. Mckenize Mezera, MD   Patient's Medications  New Prescriptions   AMOXICILLIN-CLAVULANATE (AUGMENTIN) 875-125 MG TABLET    Take 1 tablet by mouth 2 (two) times daily.  Previous Medications   No medications on file  Modified Medications   No medications on file  Discontinued Medications   AZITHROMYCIN (ZITHROMAX) 250 MG TABLET    Take 2 tabs today, then 1 tab daily x 4 days

## 2015-10-20 NOTE — Progress Notes (Signed)
Pre visit review using our clinic review tool, if applicable. No additional management support is needed unless otherwise documented below in the visit note. 

## 2015-10-20 NOTE — Patient Instructions (Signed)
Sudafed

## 2016-02-18 ENCOUNTER — Ambulatory Visit (INDEPENDENT_AMBULATORY_CARE_PROVIDER_SITE_OTHER): Payer: BLUE CROSS/BLUE SHIELD

## 2016-02-18 DIAGNOSIS — Z23 Encounter for immunization: Secondary | ICD-10-CM | POA: Diagnosis not present

## 2016-09-12 ENCOUNTER — Telehealth: Payer: Self-pay

## 2016-09-12 NOTE — Telephone Encounter (Signed)
Noted  

## 2016-09-12 NOTE — Telephone Encounter (Signed)
Pt has appt with Allayne Gitelman NP on 09/15/16 at 11AM.

## 2016-09-12 NOTE — Telephone Encounter (Signed)
PLEASE NOTE: All timestamps contained within this report are represented as [place] Standard Time. CONFIDENTIALTY NOTICE: This fax transmission is intended only for the addressee. It contains information that is legally privileged, confidential or otherwise protected from use or disclosure. If you are not the intended recipient, you are strictly prohibited from reviewing, disclosing, copying using or disseminating any of this information or taking any action in reliance on or regarding this information. If you have received this fax in error, please notify us immediately by telephone so that we can arrange for its return to [place]. Phone: [phone], Toll-Free: [phone], Fax: [phone] Page: 1 of 1 Call Id: [id number] [facility] Day - Client TELEPHONE ADVICE RECORD [facility] Patient Name: Franklin Curry Gender: Male DOB: [birth year] Age: 26 Y 8 M 23 D Return Phone Number: [phone] (Primary), [phone] (Secondary) City/State/Zip: [place] [address] Client [facility] Day - Client Client Site [facility] - Day Physician AA - PHYSICIAN, Crissie Figures- MD Who Is Calling Patient / Member / Family / Caregiver Call Type Triage / Clinical Relationship To Patient Self Return Phone Number [phone] (Primary) Chief Complaint Chest Pain (non urgent symptoms) Reason for Call Symptomatic / Request for Health Information Initial Comment Caller says he had a loud cracking/ popping noise on his chest. He states that he feels some type of pain on his chest and then stretches and when his chest pops the pain goes away. Appointment Disposition EMR Appointment Attempted - Not Scheduled Info pasted into Epic No Nurse Assessment Nurse: Roxana Hires, RN, [place] Date/Time (Eastern Time): 09/12/2016 1:58:58 PM Confirm and document reason for call. If symptomatic, describe symptoms. ---Caller states he has pain in his  chest and it pops and goes away when he stretches. Started beginning last week Does the PT have any chronic conditions? (i.e. diabetes, asthma, etc.) ---No Guidelines Guideline Title Affirmed Question Chest Pain [1] Chest pain lasting <= 5 minutes AND [2] NO chest pain or cardiac symptoms now (Exceptions: pains lasting a few seconds) Disp. Time Lamount Cohen Time) Disposition Final User 09/12/2016 2:02:03 PM See Physician within 24 Hours Yes Zellers, RN, [place] Referrals REFERRED TO PCP OFFICE Care Advice Given Per Guideline SEE PHYSICIAN WITHIN 24 HOURS: CALL BACK IF: * Difficulty breathing occurs * Chest pain increases in frequency, duration or severity * Chest pain lasts over 5 minutes * You become worse. Comments User: Elinor Parkinson, RN Date/Time Lamount Cohen Time): 09/12/2016 2:03:19 PM caller reqeust friday appt

## 2016-09-15 ENCOUNTER — Ambulatory Visit (INDEPENDENT_AMBULATORY_CARE_PROVIDER_SITE_OTHER): Payer: Managed Care, Other (non HMO) | Admitting: Primary Care

## 2016-09-15 ENCOUNTER — Encounter: Payer: Self-pay | Admitting: Primary Care

## 2016-09-15 ENCOUNTER — Ambulatory Visit (INDEPENDENT_AMBULATORY_CARE_PROVIDER_SITE_OTHER)
Admission: RE | Admit: 2016-09-15 | Discharge: 2016-09-15 | Disposition: A | Discharge status: Home / Self Care | Payer: BLUE CROSS/BLUE SHIELD | Source: Ambulatory Visit | Attending: Primary Care | Admitting: Primary Care

## 2016-09-15 VITALS — BP 116/72 | HR 55 | Temp 98.6°F | Ht 68.0 in | Wt 123.1 lb

## 2016-09-15 DIAGNOSIS — R0789 Other chest pain: Secondary | ICD-10-CM

## 2016-09-15 NOTE — Progress Notes (Signed)
Subjective:    Patient ID: Franklin Curry, male    DOB: 27-Apr-1991, 26 y.o.   MRN: [id number]  HPI  Franklin Curry is a 26 year old male who presents today with a chief complaint of sternal chest pain. His pain initially occured Friday last week.   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 He's paid by how quickly and how much merchandise he can "run" (lifting and moving heavy merchandise). Over the past 12- months he's "running" more merchandise as he's trying to save up for vacation.   He arose form the couch Friday last week after work and experienced a pain to the sternal region of his chest which lasted for 2 minutes. He then arose from the couch, stretched his arms above his head and noticed a "popping" sensation with immediate resolve of his pain. This occurred again once on Saturday and again once on Sunday, both episodes improved with stretching followed by a "popping" sound to the sternum. He did work both of these days and thinks he worked even harder than usual.  He's had no symptoms since Sunday this week.   He described his pain as  "hard, sharp, knife". He denies chest pain, shortness of breath, nausea, esophageal burning, cough, fevers. He feels very well. He's continued to work just as hard, if not harder and longer, since his pain last week. He did take aspirin once and Tums once without improvement. The only thing that helped his pain was stretching his arms above his head with a subesquent "popping" sensation.   Review of Systems  Constitutional: Negative for fever.  HENT: Negative for congestion.   Respiratory: Negative for cough and shortness of breath.   Cardiovascular: Negative for chest pain.  Skin: Negative for color change.  Neurological: Negative for numbness.       Past Medical History:  Diagnosis Date  . Allergic rhinitis   . History of attention deficit disorder    prior on adderall and vyvanse, now  off all meds; neuro eval from 2004 scanned into chart  . MVP (mitral valve prolapse) 2006   minor     Social History   Social History  . Marital status: Unknown    Spouse name: N/A  . Number of children: N/A  . Years of education: N/A   Occupational History  . Not on file.   Social History Main Topics  . Smoking status: Never Smoker  . Smokeless tobacco: Former Neurosurgeon    Types: Snuff    Quit date: 03/17/2016     Comment: 1 can/wk  . Alcohol use 0.0 oz/week     Comment: occasional  . Drug use: No  . Sexual activity: Not on file   Other Topics Concern  . Not on file   Social History Narrative   Lives with parents, and in firehouse   Occupation: Passenger transport manager at [employer], Engineer, water   Edu: HS   Activity: active at work   Diet: good water, no fruits/vegetables     No past surgical history on file.  Family History  Problem Relation Age of Onset  . Arthritis Maternal Grandmother   . Arthritis Maternal Grandfather   . Diabetes Maternal Grandfather   . Cancer Other     both grandmothers, unsure type  . CAD Neg Hx   . Stroke Neg Hx     No Known Allergies  No current outpatient prescriptions on file  prior to visit.   No current facility-administered medications on file prior to visit.     BP 116/72   Pulse (!) 55   Temp 98.6 F (37 C) (Oral)   Ht  (1.727 m)   Wt 123 lb 1.9 oz (55.8 kg)   SpO2 99%   BMI 18.72 kg/m    Objective:   Physical Exam  Constitutional: He appears well-nourished.  Neck: Neck supple.  Cardiovascular: Normal rate and regular rhythm.   Pulmonary/Chest: Effort normal and breath sounds normal.  Musculoskeletal:  No bony abnormality to chest wall. No crepitus.  Skin: Skin is warm and dry.          Assessment & Plan:  Sternal Chest Pain:  Three episodes on different days, no symptoms since Sunday this week. Exam today without evidence of bony or other abnormality. No respiratory symptoms. Do  not suspect GERD. Suspect MSK etiology secondary to repetitive lifting and overworking himself at work, but will rule out bony/structural abnormality with chest xray today.  Discussed to slow down at work. Discussed regular stretching each night after work. Discussed regular exercise to build strength.  Follow up PRN.  Morrie Sheldon, NP

## 2016-09-15 NOTE — Patient Instructions (Addendum)
Your symptoms are likely due to irritation to the tissue and/or muscles of the chest wall.  If the symptoms occur again, try stretching and/or Ibuprofen to help with inflammation/pain.  Complete xray(s) prior to leaving today. I will notify you of your results once received.  It was a pleasure meeting you!

## 2016-09-15 NOTE — Progress Notes (Signed)
Pre visit review using our clinic review tool, if applicable. No additional management support is needed unless otherwise documented below in the visit note. 

## 2017-03-02 ENCOUNTER — Ambulatory Visit: Payer: BLUE CROSS/BLUE SHIELD

## 2017-03-02 ENCOUNTER — Ambulatory Visit (INDEPENDENT_AMBULATORY_CARE_PROVIDER_SITE_OTHER): Payer: BLUE CROSS/BLUE SHIELD | Admitting: Primary Care

## 2017-03-02 VITALS — BP 112/70 | HR 82 | Temp 98.0°F | Ht 68.0 in | Wt 126.8 lb

## 2017-03-02 DIAGNOSIS — J302 Other seasonal allergic rhinitis: Secondary | ICD-10-CM

## 2017-03-02 NOTE — Progress Notes (Signed)
Subjective:    Patient ID: Franklin Curry, male    DOB: [birth year], 26 y.o.   MRN: [id number]  HPI  Franklin Curry is a 26 year old male with a history of allergic rhinitis who presents today with a chief complaint of sore throat. He also reports nasal congestion, post nasal drip, rhinorrhea, voice hoarseness, and cough. His symptoms began 4 days ago with the sore throat, cough began two days ago. He denies fevers. He's taken Sudafed, OTC allergy medication, Aleve, Afrin nasal spray with some improvement.   Review of Systems  Constitutional: Negative for chills and fever.  HENT: Positive for congestion, postnasal drip, rhinorrhea and sore throat. Negative for ear pain.   Respiratory: Positive for cough. Negative for shortness of breath.   Allergic/Immunologic: Positive for environmental allergies.       Past Medical History:  Diagnosis Date  . Allergic rhinitis   . History of attention deficit disorder    prior on adderall and vyvanse, now off all meds; neuro eval from 2004 scanned into chart  . MVP (mitral valve prolapse) 2006   minor     Social History   Social History  . Marital status: Unknown    Spouse name: N/A  . Number of children: N/A  . Years of education: N/A   Occupational History  . Not on file.   Social History Main Topics  . Smoking status: Never Smoker  . Smokeless tobacco: Former Neurosurgeon    Types: Snuff    Quit date: 03/17/2016     Comment: 1 can/wk  . Alcohol use 0.0 oz/week     Comment: occasional  . Drug use: No  . Sexual activity: Not on file   Other Topics Concern  . Not on file   Social History Narrative   Lives with parents, and in firehouse   Occupation: Passenger transport manager at [employer], Engineer, water   Edu: HS   Activity: active at work   Diet: good water, no fruits/vegetables     No past surgical history on file.  Family History  Problem Relation Age of Onset  . Arthritis Maternal Grandmother   . Arthritis  Maternal Grandfather   . Diabetes Maternal Grandfather   . Cancer Other        both grandmothers, unsure type  . CAD Neg Hx   . Stroke Neg Hx     No Known Allergies  No current outpatient prescriptions on file prior to visit.   No current facility-administered medications on file prior to visit.     BP 112/70   Pulse 82   Temp 98 F (36.7 C) (Oral)   Ht  (1.727 m)   Wt 126 lb 12.8 oz (57.5 kg)   SpO2 98%   BMI 19.28 kg/m    Objective:   Physical Exam  Constitutional: He appears well-nourished. He does not appear ill.  HENT:  Right Ear: Tympanic membrane and ear canal normal.  Left Ear: Tympanic membrane and ear canal normal.  Nose: Mucosal edema present. Right sinus exhibits no maxillary sinus tenderness and no frontal sinus tenderness. Left sinus exhibits no maxillary sinus tenderness and no frontal sinus tenderness.  Mouth/Throat: Oropharynx is clear and moist.  Eyes: Conjunctivae are normal.  Neck: Neck supple.  Cardiovascular: Normal rate and regular rhythm.   Pulmonary/Chest: Effort normal and breath sounds normal. He has no wheezes. He has no rales.  Skin: Skin is warm and dry.  Assessment & Plan:  URI vs Allergic Rhinitis:  Cough, congestion, post nasal drip x 4 days. Exam today without evidence of bacterial involvement. Suspect allergy vs early viral cause and will treat with supportive measures. Discussed not to exceed 7 days of Sudafed, stop Afrin. Continue Aleve, add Zyrtec after sudafed complete, add Delsym/Robitussin.  Fluids, rest, follow up PRN.  Morrie Sheldon, NP

## 2017-03-02 NOTE — Patient Instructions (Signed)
Do not take the sudafed longer than 7 days. Resume the allergy medication once you've finished the sudafed.  Start Delsym or Robitussin for cough.   You may take Aleve as needed for sore throat. Try gargling warm salt water three times daily as needed for sore throat. You can also use chloraseptic spray.  Ensure you are staying hydrated and rest.  It was a pleasure meeting you!  Cough, Adult Coughing is a reflex that clears your throat and your airways. Coughing helps to heal and protect your lungs. It is normal to cough occasionally, but a cough that happens with other symptoms or lasts a long time may be a sign of a condition that needs treatment. A cough may last only 2-3 weeks (acute), or it may last longer than 8 weeks (chronic). What are the causes? Coughing is commonly caused by:  Breathing in substances that irritate your lungs.  A viral or bacterial respiratory infection.  Allergies.  Asthma.  Postnasal drip.  Smoking.  Acid backing up from the stomach into the esophagus (gastroesophageal reflux).  Certain medicines.  Chronic lung problems, including COPD (or rarely, lung cancer).  Other medical conditions such as heart failure.  Follow these instructions at home: Pay attention to any changes in your symptoms. Take these actions to help with your discomfort:  Take medicines only as told by your health care provider. ? If you were prescribed an antibiotic medicine, take it as told by your health care provider. Do not stop taking the antibiotic even if you start to feel better. ? Talk with your health care provider before you take a cough suppressant medicine.  Drink enough fluid to keep your urine clear or pale yellow.  If the air is dry, use a cold steam vaporizer or humidifier in your bedroom or your home to help loosen secretions.  Avoid anything that causes you to cough at work or at home.  If your cough is worse at night, try sleeping in a semi-upright  position.  Avoid cigarette smoke. If you smoke, quit smoking. If you need help quitting, ask your health care provider.  Avoid caffeine.  Avoid alcohol.  Rest as needed.  Contact a health care provider if:  You have new symptoms.  You cough up pus.  Your cough does not get better after 2-3 weeks, or your cough gets worse.  You cannot control your cough with suppressant medicines and you are losing sleep.  You develop pain that is getting worse or pain that is not controlled with pain medicines.  You have a fever.  You have unexplained weight loss.  You have night sweats. Get help right away if:  You cough up blood.  You have difficulty breathing.  Your heartbeat is very fast. This information is not intended to replace advice given to you by your health care provider. Make sure you discuss any questions you have with your health care provider. Document Released: 11/18/2010 Document Revised: 10/28/2015 Document Reviewed: 07/29/2014 Elsevier Interactive Patient Education  2017 [employer].

## 2017-03-06 ENCOUNTER — Telehealth: Payer: Self-pay

## 2017-03-06 NOTE — Telephone Encounter (Signed)
I spoke with pt and he is not having any problem getting his breath; thinks he scratched his throat while eating and will cb to schedule appt if needed. FYI to Dr Reece Agar.

## 2017-03-06 NOTE — Telephone Encounter (Signed)
PLEASE NOTE: All timestamps contained within this report are represented as [place] Standard Time. CONFIDENTIALTY NOTICE: This fax transmission is intended only for the addressee. It contains information that is legally privileged, confidential or otherwise protected from use or disclosure. If you are not the intended recipient, you are strictly prohibited from reviewing, disclosing, copying using or disseminating any of this information or taking any action in reliance on or regarding this information. If you have received this fax in error, please notify us immediately by telephone so that we can arrange for its return to [place]. Phone: [phone], Toll-Free: [phone], Fax: [phone] Page: 1 of 2 Call Id: [id number] [facility] Day - Client TELEPHONE ADVICE RECORD [facility] Call Center Patient Name: Franklin Curry Gender: Male DOB: 11/20/1990 Age: 26 Y 3 M 14 D Return Phone Number: [phone] (Primary) Address: City/State/Zip: [place] [address] Client [facility] Day - Client Client Site  [facility] [place] - Day Physician [facility] - PHYSICIAN, Crissie Figures- MD Contact Type Call Who Is Calling Patient / Member / Family / Caregiver Call Type Triage / Clinical Relationship To Patient Self Return Phone Number [phone] (Primary) Chief Complaint Sore Throat Reason for Call Symptomatic / Request for Health Information Initial Comment Caller says he feels like there is something in his throat and he feels it when he swallows. Appointment Disposition EMR Patient Reports Appointment Already Scheduled Info pasted into Epic Yes Translation No Nurse Assessment Nurse: Fransisco Hertz, RN, Elnita Maxwell Date/Time Lamount Cohen Time): 03/06/2017 3:41:50 PM Confirm and document reason for call. If symptomatic, describe symptoms. ---Caller states he feels like there is something in his throat and he feels it when he swallows. It feels like a lump  and his tonsils are inflamed. He is getting over a cold. Does the patient have any new or worsening symptoms? ---Yes Will a triage be completed? ---Yes Related visit to physician within the last 2 weeks? ---N/A Does the PT have any chronic conditions? (i.e. diabetes, asthma, etc.) ---No Is this a behavioral health or substance abuse call? ---No Guidelines Guideline Title Affirmed Question Affirmed Notes Nurse Date/Time (Eastern Time) Cough - Acute Non- Productive [1] Patient also has allergy symptoms (e.g., itchy eyes, clear nasal discharge, postnasal drip) AND [2] they are acting up Fransisco Hertz, Research officer, trade [employer] 03/06/2017 3:44:25 PM Disp. Time Lamount Cohen Time) Disposition Final User 03/06/2017 3:50:57 PM See PCP When Office is Open (within 3 days) Yes Fransisco Hertz, RN, Elnita Maxwell PLEASE NOTE: All timestamps contained within this report are represented as [place] Standard Time. CONFIDENTIALTY NOTICE: This fax transmission is intended only for the addressee. It contains information that is legally privileged, confidential or otherwise protected from use or disclosure. If you are not the intended recipient, you are strictly prohibited from reviewing, disclosing, copying using or disseminating any of this information or taking any action in reliance on or regarding this information. If you have received this fax in error, please notify us immediately by telephone so that we can arrange for its return to [place]. Phone: [phone], Toll-Free: [phone], Fax: [phone] Page: 2 of 2 Call Id: [id number] Caller Disagree/Comply Comply Caller Understands Yes PreDisposition Did not know what to do Care Advice Given Per Guideline SEE PCP WITHIN 3 DAYS: HUMIDIFIER: If the air is dry, use a humidifier in the bedroom. (Reason: dry air makes coughs worse) * Examples include diphenhydramine (Benadryl) and chlorpheniramine (Chlortrimeton, Chlor-tripolon) CALL BACK IF: * Fever over 103 F (39.4 C) * Difficulty  breathing occurs * You become worse. CARE  ADVICE given per Cough - Acute Non- Productive (Adult) guideline. * May cause sleepiness. Do not drink alcohol, drive, or operate dangerous machinery while taking antihistamines. Referrals REFERRED TO PCP OFFICE

## 2017-04-25 ENCOUNTER — Ambulatory Visit: Payer: Self-pay | Admitting: *Deleted

## 2017-04-25 NOTE — Telephone Encounter (Signed)
  Reason for Disposition . Cold with no complications  Answer Assessment - Initial Assessment Questions 1. ONSET: "When did the nasal discharge start?"      Yesterday morning when I woke I had a runny nose.  I took Nyquill last night it dried me up.  I took Sudephed this morning. 2. AMOUNT: "How much discharge is there?"      My nose is runny. 3. COUGH: "Do you have a cough?" If yes, ask: "Describe the color of your sputum" (clear, white, yellow, green)     Coughing up greenish/yellowing 4. RESPIRATORY DISTRESS: "Describe your breathing."      Chest tightness.   Don't smoke.  No hx of asthma. 5. FEVER: "Do you have a fever?" If so, ask: "What is your temperature, how was it measured, and when did it start?"     No.  No body aches or chills 6. SEVERITY: "Overall, how bad are you feeling right now?" (e.g., doesn't interfere with normal activities, staying home from school/work, staying in bed)      I'm off today.   I'm pacing in my garage from the [place].  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   7. OTHER SYMPTOMS: "Do you have any other symptoms?" (e.g., sore throat, earache, wheezing, vomiting)     Throat dry, No other sympoms 8. PREGNANCY: "Is there any chance you are pregnant?" "When was your last menstrual period?"     N/A  Protocols used: COMMON COLD-A-AH

## 2017-05-11 ENCOUNTER — Ambulatory Visit: Payer: BLUE CROSS/BLUE SHIELD | Admitting: Primary Care

## 2017-05-17 ENCOUNTER — Ambulatory Visit: Payer: BLUE CROSS/BLUE SHIELD

## 2017-05-17 ENCOUNTER — Ambulatory Visit (INDEPENDENT_AMBULATORY_CARE_PROVIDER_SITE_OTHER): Payer: BLUE CROSS/BLUE SHIELD

## 2017-05-17 DIAGNOSIS — Z23 Encounter for immunization: Secondary | ICD-10-CM

## 2017-06-20 ENCOUNTER — Ambulatory Visit (INDEPENDENT_AMBULATORY_CARE_PROVIDER_SITE_OTHER): Payer: Managed Care, Other (non HMO) | Admitting: Family Medicine

## 2017-06-20 ENCOUNTER — Encounter: Payer: Self-pay | Admitting: Family Medicine

## 2017-06-20 VITALS — BP 118/68 | HR 60 | Temp 98.2°F | Ht 68.0 in | Wt 121.8 lb

## 2017-06-20 DIAGNOSIS — Z131 Encounter for screening for diabetes mellitus: Secondary | ICD-10-CM | POA: Diagnosis not present

## 2017-06-20 DIAGNOSIS — Z0000 Encounter for general adult medical examination without abnormal findings: Secondary | ICD-10-CM | POA: Diagnosis not present

## 2017-06-20 DIAGNOSIS — Z13220 Encounter for screening for lipoid disorders: Secondary | ICD-10-CM | POA: Diagnosis not present

## 2017-06-20 LAB — BASIC METABOLIC PANEL
BUN: 13 mg/dL (ref 6–23)
CALCIUM: 9.2 mg/dL (ref 8.4–10.5)
CHLORIDE: 103 meq/L (ref 96–112)
CO2: 30 mEq/L (ref 19–32)
CREATININE: 0.87 mg/dL (ref 0.40–1.50)
GFR: 114.11 mL/min (ref >60.00)
Glucose, Bld: 67 mg/dL — ABNORMAL LOW (ref 70–99)
Potassium: 4.6 mEq/L (ref 3.5–5.1)
Sodium: 138 mEq/L (ref 135–145)

## 2017-06-20 LAB — LIPID PANEL
CHOL/HDL RATIO: 2
Cholesterol: 146 mg/dL (ref 0–200)
HDL: 62.6 mg/dL (ref >39.00)
LDL Cholesterol: 74 mg/dL (ref 0–99)
NonHDL: 83.75
TRIGLYCERIDES: 48 mg/dL (ref 0.0–149.0)
VLDL: 9.6 mg/dL (ref 0.0–40.0)

## 2017-06-20 NOTE — Progress Notes (Signed)
BP 118/68 (BP Location: Left Arm, Patient Position: Sitting, Cuff Size: Normal)   Pulse 60   Temp 98.2 F (36.8 C) (Oral)   Ht 5\' 8"  (1.727 m)   Wt 121 lb 12 oz (55.2 kg)   SpO2 98%   BMI 18.51 kg/m    CC: CPE Subjective:    Patient ID: Jaymason Ledesma, male    DOB: 01-Jun-1991, 27 y.o.   MRN: [id number]  HPI: Atom Solivan is a 27 y.o. male presenting on 06/20/2017 for Annual Exam (Has form to be signed.  Pt will take form home and enter lab results.)   Yearly physical for insurance purposes.   Preventative: Flu shot yearly  Tetanus shot - 12/2007  Seat belt use discussed  Sunscreen use discussed. No changing moles on skin.  Non smoker - GF smokes outside  Alcohol - rare  Currently sexually active with GF - 1 partner. She has IUD.   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 Interim medical history since our last visit reviewed. Allergies and medications reviewed and updated. No outpatient medications prior to visit.   No facility-administered medications prior to visit.      Per HPI unless specifically indicated in ROS section below Review of Systems  Constitutional: Negative for activity change, appetite change, chills, fatigue, fever and unexpected weight change.  HENT: Negative for hearing loss.   Eyes: Negative for visual disturbance.  Respiratory: Negative for cough, chest tightness, shortness of breath and wheezing.   Cardiovascular: Negative for chest pain, palpitations and leg swelling.  Gastrointestinal: Negative for abdominal distention, abdominal pain, blood in stool, constipation, diarrhea, nausea and vomiting.  Genitourinary: Negative for difficulty urinating and hematuria.  Musculoskeletal:  Negative for arthralgias, myalgias and neck pain.  Skin: Negative for rash.  Neurological: Negative for dizziness, seizures, syncope and headaches.  Hematological: Negative for adenopathy. Does not bruise/bleed easily.  Psychiatric/Behavioral: Negative for dysphoric mood. The patient is not nervous/anxious.        Objective:    BP 118/68 (BP Location: Left Arm, Patient Position: Sitting, Cuff Size: Normal)   Pulse 60   Temp 98.2 F (36.8 C) (Oral)   Ht 5\' 8"  (1.727 m)   Wt 121 lb 12 oz (55.2 kg)   SpO2 98%   BMI 18.51 kg/m   Wt Readings from Last 3 Encounters:  06/20/17 121 lb 12 oz (55.2 kg)  03/02/17 126 lb 12.8 oz (57.5 kg)  09/15/16 123 lb 1.9 oz (55.8 kg)    Physical Exam  Constitutional: He is oriented to person, place, and time. He appears well-developed and well-nourished. No distress.  HENT:  Head: Normocephalic and atraumatic.  Right Ear: Hearing, tympanic membrane, external ear and ear canal normal.  Left Ear: Hearing, tympanic membrane, external ear and ear canal normal.  Nose: Nose normal.  Mouth/Throat: Uvula is midline, oropharynx is clear and moist and mucous membranes are normal. No oropharyngeal exudate, posterior oropharyngeal edema or posterior oropharyngeal erythema.  Eyes: Conjunctivae and EOM are normal. Pupils are equal, round, and reactive to light. No scleral icterus.  Neck: Normal range of motion. Neck supple.  Cardiovascular: Normal rate, regular rhythm, normal heart sounds and intact distal pulses.  No murmur heard. Pulses:      Radial pulses are 2+ on the right  side, and 2+ on the left side.  Pulmonary/Chest: Effort normal and breath sounds normal. No respiratory distress. He has no wheezes. He has no rales.  Abdominal: Soft. Bowel sounds are normal. He exhibits no distension and no mass. There is no tenderness. There is no rebound and no guarding.  Musculoskeletal: Normal range of motion. He exhibits no edema.  Lymphadenopathy:    He has no  cervical adenopathy.  Neurological: He is alert and oriented to person, place, and time.  CN grossly intact, station and gait intact  Skin: Skin is warm and dry. No rash noted.  Psychiatric: He has a normal mood and affect. His behavior is normal. Judgment and thought content normal.  Nursing note and vitals reviewed.     Assessment & Plan:   Problem List Items Addressed This Visit    Health maintenance examination - Primary    Preventative protocols reviewed and updated unless pt declined. Discussed healthy diet and lifestyle.        Other Visit Diagnoses    Lipid screening       Relevant Orders   Lipid panel   Diabetes mellitus screening       Relevant Orders   Basic metabolic panel       Follow up plan: Return in about 1 year (around 06/20/2018) for annual exam, prior fasting for blood work.  Eustaquio BoydenJavier Azarian Starace, MD

## 2017-06-20 NOTE — Patient Instructions (Addendum)
You are doing well today  Labs today.  Return as needed or in 1-2 years for next physical  Health Maintenance, Male A healthy lifestyle and preventive care is important for your health and wellness. Ask your health care provider about what schedule of regular examinations is right for you. What should I know about weight and diet? Eat a Healthy Diet  Eat plenty of vegetables, fruits, whole grains, low-fat dairy products, and lean protein.  Do not eat a lot of foods high in solid fats, added sugars, or salt.  Maintain a Healthy Weight Regular exercise can help you achieve or maintain a healthy weight. You should:  Do at least 150 minutes of exercise each week. The exercise should increase your heart rate and make you sweat (moderate-intensity exercise).  Do strength-training exercises at least twice a week.  Watch Your Levels of Cholesterol and Blood Lipids  Have your blood tested for lipids and cholesterol every 5 years starting at 25 years of age. If you are at high risk for heart disease, you should start having your blood tested when you are 25 years old. You may need to have your cholesterol levels checked more often if: ? Your lipid or cholesterol levels are high. ? You are older than 25 years of age. ? You are at high risk for heart disease.  What should I know about cancer screening? Many types of cancers can be detected early and may often be prevented. Lung Cancer  You should be screened every year for lung cancer if: ? You are a current smoker who has smoked for at least 30 years. ? You are a former smoker who has quit within the past 15 years.  Talk to your health care provider about your screening options, when you should start screening, and how often you should be screened.  Colorectal Cancer  Routine colorectal cancer screening usually begins at 25 years of age and should be repeated every 5-10 years until you are 25 years old. You may need to be screened more  often if early forms of precancerous polyps or small growths are found. Your health care provider may recommend screening at an earlier age if you have risk factors for colon cancer.  Your health care provider may recommend using home test kits to check for hidden blood in the stool.  A small camera at the end of a tube can be used to examine your colon (sigmoidoscopy or colonoscopy). This checks for the earliest forms of colorectal cancer.  Prostate and Testicular Cancer  Depending on your age and overall health, your health care provider may do certain tests to screen for prostate and testicular cancer.  Talk to your health care provider about any symptoms or concerns you have about testicular or prostate cancer.  Skin Cancer  Check your skin from head to toe regularly.  Tell your health care provider about any new moles or changes in moles, especially if: ? There is a change in a mole's size, shape, or color. ? You have a mole that is larger than a pencil eraser.  Always use sunscreen. Apply sunscreen liberally and repeat throughout the day.  Protect yourself by wearing long sleeves, pants, a wide-brimmed hat, and sunglasses when outside.  What should I know about heart disease, diabetes, and high blood pressure?  If you are 2118-439 years of age, have your blood pressure checked every 3-5 years. If you are 25 years of age or older, have your blood pressure checked every  year. You should have your blood pressure measured twice-once when you are at a hospital or clinic, and once when you are not at a hospital or clinic. Record the average of the two measurements. To check your blood pressure when you are not at a hospital or clinic, you can use: ? An automated blood pressure machine at a pharmacy. ? A home blood pressure monitor.  Talk to your health care provider about your target blood pressure.  If you are between 47-9 years old, ask your health care provider if you should take  aspirin to prevent heart disease.  Have regular diabetes screenings by checking your fasting blood sugar level. ? If you are at a normal weight and have a low risk for diabetes, have this test once every three years after the age of 33. ? If you are overweight and have a high risk for diabetes, consider being tested at a younger age or more often.  A one-time screening for abdominal aortic aneurysm (AAA) by ultrasound is recommended for men aged 32-75 years who are current or former smokers. What should I know about preventing infection? Hepatitis B If you have a higher risk for hepatitis B, you should be screened for this virus. Talk with your health care provider to find out if you are at risk for hepatitis B infection. Hepatitis C Blood testing is recommended for:  Everyone born from [birth year] through 1963.  Anyone with known risk factors for hepatitis C.  Sexually Transmitted Diseases (STDs)  You should be screened each year for STDs including gonorrhea and chlamydia if: ? You are sexually active and are younger than 25 years of age. ? You are older than 25 years of age and your health care provider tells you that you are at risk for this type of infection. ? Your sexual activity has changed since you were last screened and you are at an increased risk for chlamydia or gonorrhea. Ask your health care provider if you are at risk.  Talk with your health care provider about whether you are at high risk of being infected with HIV. Your health care provider may recommend a prescription medicine to help prevent HIV infection.  What else can I do?  Schedule regular health, dental, and eye exams.  Stay current with your vaccines (immunizations).  Do not use any tobacco products, such as cigarettes, chewing tobacco, and e-cigarettes. If you need help quitting, ask your health care provider.  Limit alcohol intake to no more than 2 drinks per day. One drink equals 12 ounces of beer, 5 ounces of  wine, or 1 ounces of hard liquor.  Do not use street drugs.  Do not share needles.  Ask your health care provider for help if you need support or information about quitting drugs.  Tell your health care provider if you often feel depressed.  Tell your health care provider if you have ever been abused or do not feel safe at home. This information is not intended to replace advice given to you by your health care provider. Make sure you discuss any questions you have with your health care provider. Document Released: 11/18/2007 Document Revised: 01/19/2016 Document Reviewed: 02/23/2015 Elsevier Interactive Patient Education  [employer].

## 2017-06-20 NOTE — Assessment & Plan Note (Signed)
Preventative protocols reviewed and updated unless pt declined. Discussed healthy diet and lifestyle.  

## 2017-07-19 ENCOUNTER — Telehealth: Payer: Self-pay | Admitting: Family Medicine

## 2017-07-19 NOTE — Telephone Encounter (Signed)
Copied from CRM [phone]. Topic: Quick Communication - Lab Results >> Jul 19, 2017  9:39 AM Lelon FrohlichGolden, Shelda Truby, RMA wrote: Pt would like his lab results please call mobile number in chart

## 2017-07-19 NOTE — Telephone Encounter (Signed)
Spoke with pt about labs.  Says he saw them on MyChart and got the info he needed but expresses his thanks for the call.

## 2017-09-12 ENCOUNTER — Telehealth: Payer: Self-pay | Admitting: Family Medicine

## 2017-09-12 NOTE — Telephone Encounter (Signed)
Left message on vm per dpr relaying message per Dr. G.  

## 2017-09-12 NOTE — Telephone Encounter (Signed)
Not needed for him unless he really wants to have it, and if done it may not be covered by his insurance and he may have to pay out of pocket. Let me know what he decides.

## 2017-09-12 NOTE — Telephone Encounter (Signed)
Copied from CRM [id number]. Topic: Quick Communication - See Telephone Encounter >> Sep 12, 2017  2:45 PM Arlyss Gandyichardson, Emoni Yang N, NT wrote: CRM for notification. See Telephone encounter for: 09/12/17. Pt would like to speak with Dr. Sharen HonesGutierrez or his nurse to see if he would be ok to come in for the HPV vaccine? According to the CDC men only need to be vaccinated up to age 25.  Pt states that he has had the same girlfriend for 5 years and really have no reason to get the shot other than he just wants it to better protect himself. Please advise.

## 2017-09-12 NOTE — Telephone Encounter (Signed)
Pt seen 06/20/17.Please advise.

## 2017-09-12 NOTE — Telephone Encounter (Signed)
Patient no longer wants the HPV vaccine.

## 2017-09-13 NOTE — Telephone Encounter (Signed)
Noted  

## 2017-12-13 ENCOUNTER — Ambulatory Visit: Payer: BLUE CROSS/BLUE SHIELD

## 2018-02-21 ENCOUNTER — Encounter: Payer: Self-pay | Admitting: Family Medicine

## 2018-02-21 ENCOUNTER — Ambulatory Visit (INDEPENDENT_AMBULATORY_CARE_PROVIDER_SITE_OTHER): Payer: Managed Care, Other (non HMO) | Admitting: Family Medicine

## 2018-02-21 VITALS — BP 106/66 | HR 63 | Temp 98.3°F | Wt 126.2 lb

## 2018-02-21 DIAGNOSIS — H6980 Other specified disorders of Eustachian tube, unspecified ear: Secondary | ICD-10-CM | POA: Diagnosis not present

## 2018-02-21 DIAGNOSIS — K120 Recurrent oral aphthae: Secondary | ICD-10-CM | POA: Diagnosis not present

## 2018-02-21 NOTE — Patient Instructions (Addendum)
Likely aphthous ulcer and ETD likely from the same cause.   Likely a resolving viral issue.   Gargle with salt water and gently try to pop your ears as needed.  Still okay to get a flu shot today.  Take care.  Glad to see you.

## 2018-02-21 NOTE — Progress Notes (Signed)
Working in a garage as a Publishing copydiesel technician.  He is working around a lot of fumes, exhaust, etc.  He wears gloves a lot, as much as he can at work.  He washes his hands frequently.  More recently with "sore-but-not-really" throat.  He noted one irritated gland in his neck.  ST resolved now.  Noted prev at work.    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    No wheeze.  Not SOB.  No fevers.  No cough usually.  Rare phlegm production.  No rhinorrhea.    He had some recent R sided ETD but that is better now.    Meds, vitals, and allergies reviewed.   ROS: Per HPI unless specifically indicated in ROS section   nad ncat TM wnl, w/o erythema Nasal exam wnl OP wnl except for aphthous ulcer noted x2, one each on the R posterior pharynx and posterior to lower central incisors.  Neck supple, no LA rrr ctab Skin w/o rash.

## 2018-02-22 DIAGNOSIS — H6980 Other specified disorders of Eustachian tube, unspecified ear: Secondary | ICD-10-CM | POA: Insufficient documentation

## 2018-02-22 DIAGNOSIS — K120 Recurrent oral aphthae: Secondary | ICD-10-CM | POA: Insufficient documentation

## 2018-02-22 NOTE — Assessment & Plan Note (Signed)
Likely with benign self resolving viral infection that caused both aphthous ulcer and eustachian tube dysfunction.  Anatomy and pathophysiology discussed with patient.  Supportive care.  He can gently try to pop his ears with Valsalva maneuver.  Still okay to get a flu shot today.  Update me as needed.  He agrees.

## 2018-10-17 ENCOUNTER — Ambulatory Visit (INDEPENDENT_AMBULATORY_CARE_PROVIDER_SITE_OTHER): Payer: Managed Care, Other (non HMO) | Admitting: Family Medicine

## 2018-10-17 ENCOUNTER — Encounter: Payer: Self-pay | Admitting: Family Medicine

## 2018-10-17 ENCOUNTER — Other Ambulatory Visit: Payer: Self-pay

## 2018-10-17 VITALS — Ht 71.0 in | Wt 130.0 lb

## 2018-10-17 DIAGNOSIS — J22 Unspecified acute lower respiratory infection: Secondary | ICD-10-CM

## 2018-10-17 NOTE — Progress Notes (Signed)
Virtual visit attempted through [employer]. Due to national recommendations of social distancing due to COVID 19, a virtual visit is felt to be most appropriate for this patient at this time. Interactive audio and video telecommunications were attempted between myself and Franklin Curry, however failed due to patient having technical difficulties. We continued and completed visit with audio only.   Patient location: in his truck Provider location: Adult nurse at [facility], office If any vitals were documented, they were collected by patient at home unless specified below.    Ht 5\' 11"  (1.803 m)   Wt 130 lb (59 kg)   BMI 18.13 kg/m    CC: chest congestion Subjective:    Patient ID: Franklin Curry, male    DOB: 1990-12-06, 28 y.o.   MRN: [id number]  HPI: Franklin Curry is a 28 y.o. male presenting on 10/17/2018 for Chest congestion X 3 days   4d h/o sinus congestion that has progressed down throat into chest. + ST. Chest > head congestion. Dry cough present. Otherwise feeling well. Chest feels sore from coughing. Some loss of taste. Cough not affecting sleep.   No fevers/chills, ear or tooth pain, dyspnea or wheezing, HA, body aches, loss of smell, diarrhea.  No h/o asthma.  No smokers at home.  No sick contacts at home or work.  Dad and grandfather high risk for complications from illness.  Increased stress recently.   Has tried gargling with salt water and peroxide with good benefit. Showering with steam also helps. Has tried sudafed and benadryl with benefit as well.   Lives with parents and grandfather (father and GFA at increased risk of complications due to chronic medical illnesses).   Currently working as Games developer at [employer].      Relevant past medical, surgical, family and social history reviewed and updated as indicated. Interim medical history since our last visit reviewed. Allergies and medications reviewed and updated. No outpatient  medications prior to visit.   No facility-administered medications prior to visit.      Per HPI unless specifically indicated in ROS section below Review of Systems Objective:    Ht 5\' 11"  (1.803 m)   Wt 130 lb (59 kg)   BMI 18.13 kg/m   Wt Readings from Last 3 Encounters:  10/17/18 130 lb (59 kg)  02/21/18 126 lb 4 oz (57.3 kg)  06/20/17 121 lb 12 oz (55.2 kg)     Physical exam: Gen: alert, NAD, not ill appearing Pulm: speaks in complete sentences without increased work of breathing Psych: normal mood, normal thought content      Assessment & Plan:   Problem List Items Addressed This Visit    Acute respiratory infection - Primary    Symptoms most consistent with viral respiratory infection. Supportive care reviewed. Discussed NSAID use as well as OTC cough syrup. Update if not improving over time. Red flags to seek further care reviewed. He does live in household with individuals at high risk of complication from Covid19 infection. For this reason I did recommend pt get tested at local [facility] public testing site. He declined. He will call us if changes his mind. Reviewed following self isolation measures at home including mask use.           No orders of the defined types were placed in this encounter.  No orders of the defined types were placed in this encounter.   Follow up plan: Return if symptoms worsen or fail to improve.  Franklin Boyden,  MD

## 2018-10-17 NOTE — Assessment & Plan Note (Addendum)
Symptoms most consistent with viral respiratory infection. Supportive care reviewed. Discussed NSAID use as well as OTC cough syrup. Update if not improving over time. Red flags to seek further care reviewed. He does live in household with individuals at high risk of complication from Covid19 infection. For this reason I did recommend pt get tested at local [facility] public testing site. He declined. He will call us if changes his mind. Reviewed following self isolation measures at home including mask use.

## 2018-10-22 ENCOUNTER — Telehealth: Payer: Self-pay

## 2018-10-22 NOTE — Telephone Encounter (Signed)
Pt called back to let you know that he didn't meet requirement to be tested

## 2018-10-22 NOTE — Telephone Encounter (Signed)
Noted. Thank you. Touched base with Angelica Chessman about this.

## 2018-10-22 NOTE — Telephone Encounter (Signed)
[place] told him Franklin Curry was doing free [phone] testing he was going to try there

## 2018-10-22 NOTE — Telephone Encounter (Signed)
Pt called back I gave him number [place] [phone] testing number [phone] pt will call back to let us know if he made appointment

## 2018-10-22 NOTE — Telephone Encounter (Signed)
Patient just had virtual visit with Dr. Reece Agar on 10/18/18 dx with acute respiratory infection.  Patient calls back this am reporting runny nose, allergies and is requesting to be tested for COVID.   Dr. Reece Agar made recommendation for testing on 10/18/18 but patient declined at the time.  I was unable to reach patient by phone but did leave a detailed message on his cell VM (okay per DPR) with [place] [facility] phone number for scheduling a COVID test and asked that he please call us back with an update on how he is doing and if he was successful in getting tested.   He does not meet the criteria unfortunately for [facility] testing but appears to meet [facility] testing criteria.  FYI to Dr. Reece Agar.

## 2018-10-22 NOTE — Telephone Encounter (Signed)
[facility] Night - Client Nonclinical Telephone Record [facility] Medical Call Center Client [facility] Night - Client Client Site [facility] - Night Physician Eustaquio Boyden - MD Contact Type Call Who Is Calling Patient / Member / Family / Caregiver Caller Name Olyver Vandervelden Caller Phone Number [phone] Patient Name Franklin Curry Patient DOB [birth year] Call Type Message Only Information Provided Reason for Call Request to Schedule Office Appointment Initial Comment Caller states, he is calling back. Runny nose, allergies, and he is wanting to get tested for the COVID-19 Additional Comment Stated he will call the office back. Call Closed By: Holley Raring Transaction Date/Time: 10/21/2018 7:47:51 PM (ET)

## 2018-10-23 NOTE — Telephone Encounter (Signed)
I called the [facility] department and spoke directly with their triage screening nurse regarding this patient.    I am not sure what he communicated with them, but based on the symptoms he was having, he would have been approved for the testing.    The symptoms criteria list for them is very extensive and even extends beyond what they sent out in the press release.  I will send an update to everyone with this list.  If a patient answers yes to any of the symptoms, (in his case cough with recent acute resp infection dx) would have been enough to have him tested.  But if all he communicated was allergies and a runny nose, that unfortunately is not on the list of criteria.    FYI to Dr. Sharen Hones.

## 2018-11-12 ENCOUNTER — Telehealth: Payer: Self-pay | Admitting: Family Medicine

## 2018-11-12 NOTE — Telephone Encounter (Signed)
Copied from [place] [id number]. Topic: Appointment Scheduling - Scheduling Inquiry for Clinic >> Nov 11, 2018  4:15 PM Franklin Curry wrote: Reason for CRM: pt called in and would like to sch Curry cpe with Dr Mary Sella number  [phone]

## 2018-11-12 NOTE — Telephone Encounter (Signed)
Pt scheduled cpe on 11/19/18 with labs later on the same day.

## 2018-11-16 ENCOUNTER — Other Ambulatory Visit: Payer: Self-pay | Admitting: Family Medicine

## 2018-11-16 DIAGNOSIS — Z131 Encounter for screening for diabetes mellitus: Secondary | ICD-10-CM

## 2018-11-19 ENCOUNTER — Ambulatory Visit: Payer: Managed Care, Other (non HMO)

## 2018-11-19 ENCOUNTER — Other Ambulatory Visit (INDEPENDENT_AMBULATORY_CARE_PROVIDER_SITE_OTHER): Payer: Managed Care, Other (non HMO)

## 2018-11-19 ENCOUNTER — Encounter: Payer: Self-pay | Admitting: Family Medicine

## 2018-11-19 ENCOUNTER — Other Ambulatory Visit: Payer: Self-pay

## 2018-11-19 ENCOUNTER — Ambulatory Visit (INDEPENDENT_AMBULATORY_CARE_PROVIDER_SITE_OTHER): Payer: Managed Care, Other (non HMO) | Admitting: Family Medicine

## 2018-11-19 VITALS — BP 102/60 | HR 77 | Temp 98.8°F | Ht 71.0 in | Wt 130.0 lb

## 2018-11-19 DIAGNOSIS — Z0000 Encounter for general adult medical examination without abnormal findings: Secondary | ICD-10-CM | POA: Diagnosis not present

## 2018-11-19 DIAGNOSIS — Z131 Encounter for screening for diabetes mellitus: Secondary | ICD-10-CM

## 2018-11-19 DIAGNOSIS — Z23 Encounter for immunization: Secondary | ICD-10-CM

## 2018-11-19 DIAGNOSIS — Z113 Encounter for screening for infections with a predominantly sexual mode of transmission: Secondary | ICD-10-CM | POA: Diagnosis not present

## 2018-11-19 LAB — BASIC METABOLIC PANEL
BUN: 10 mg/dL (ref 6–23)
CO2: 29 mEq/L (ref 19–32)
Calcium: 9.3 mg/dL (ref 8.4–10.5)
Chloride: 100 mEq/L (ref 96–112)
Creatinine, Ser: 0.81 mg/dL (ref 0.40–1.50)
GFR: 115.27 mL/min (ref >60.00)
Glucose, Bld: 76 mg/dL (ref 70–99)
Potassium: 3.9 mEq/L (ref 3.5–5.1)
Sodium: 136 mEq/L (ref 135–145)

## 2018-11-19 NOTE — Assessment & Plan Note (Signed)
Preventative protocols reviewed and updated unless pt declined. Discussed healthy diet and lifestyle.  

## 2018-11-19 NOTE — Addendum Note (Signed)
Addended by: Magdalen Spatz C on: 11/19/2018 02:28 PM   Modules accepted: Orders

## 2018-11-19 NOTE — Addendum Note (Signed)
Addended by: Cloyd Stagers on: 11/19/2018 01:44 PM   Modules accepted: Orders

## 2018-11-19 NOTE — Progress Notes (Signed)
Virtual visit attempted through [facility] but it was down. We tried through [place], [place] unable to access video. Due to national recommendations of social distancing due to COVID-19, a virtual visit is felt to be most appropriate for this patient at this time. Reviewed limitations of a virtual visit.   Patient location: at parking lot at work.  [facility] If any vitals were documented, they were collected by patient at home unless specified below.    Ht 5\' 11"  (1.803 m)   BMI 18.13 kg/m   Vitals will be checked when he arrives for labs today. Td at that time as well.   CC: CPE Subjective:    Patient ID: Franklin Curry, male    DOB: 07-22-1990, 28 y.o.   MRN: [id number]  HPI: Franklin Curry is a 28 y.o. male presenting on 11/19/2018 for Annual Exam   Yearly physical for insurance purposes.   Was in relationship for 4 years, then broke up - GF was cheating on him so he wants STD screen.   Has started walking more regularly.   Preventative: Flu shot yearly  Tdap - 12/2007, requests rpt today. Had recent finger cut at work last month. Seat belt use discussed  Sunscreen use discussed. No changing moles on skin  Non smoker  Alcohol - rare  Currently not sexually active. She has IUD.  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 Interim medical history since our last visit reviewed. Allergies and medications reviewed and updated. No outpatient medications prior to visit.   No facility-administered medications prior to visit.      Per HPI unless specifically indicated in ROS section below Review of Systems  Constitutional: Negative for activity  change, appetite change, chills, fatigue, fever and unexpected weight change.  HENT: Negative for hearing loss.   Eyes: Negative for visual disturbance.  Respiratory: Negative for cough, chest tightness, shortness of breath and wheezing.   Cardiovascular: Negative for chest pain, palpitations and leg swelling.  Gastrointestinal: Negative for abdominal distention, abdominal pain, blood in stool, constipation, diarrhea, nausea and vomiting.  Genitourinary: Negative for difficulty urinating and hematuria.  Musculoskeletal: Negative for arthralgias, myalgias and neck pain.  Skin: Negative for rash.  Neurological: Negative for dizziness, seizures, syncope and headaches.  Hematological: Negative for adenopathy. Does not bruise/bleed easily.  Psychiatric/Behavioral: Negative for dysphoric mood. The patient is not nervous/anxious.    Objective:    Ht 5\' 11"  (1.803 m)   BMI 18.13 kg/m   Wt Readings from Last 3 Encounters:  10/17/18 130 lb (59 kg)  02/21/18 126 lb 4 oz (57.3 kg)  06/20/17 121 lb 12 oz (55.2 kg)     Physical exam: Gen: alert, NAD, not ill appearing Pulm: speaks in complete sentences without increased work of breathing Psych: normal mood, normal thought content      Results for orders placed or performed in visit on 06/20/17  Lipid panel  Result Value Ref Range   Cholesterol 146 0 - 200 mg/dL   Triglycerides 25.948.0 0.0 - 149.0 mg/dL   HDL 56.3862.60 >75.64>39.00 mg/dL   VLDL 9.6 0.0 - 33.240.0 mg/dL   LDL Cholesterol 74 0 - 99 mg/dL   Total CHOL/HDL  Ratio 2    NonHDL 99.24   Basic metabolic panel  Result Value Ref Range   Sodium 138 135 - 145 mEq/L   Potassium 4.6 3.5 - 5.1 mEq/L   Chloride 103 96 - 112 mEq/L   CO2 30 19 - 32 mEq/L   Glucose, Bld 67 (L) 70 - 99 mg/dL   BUN 13 6 - 23 mg/dL   Creatinine, Ser 0.87 0.40 - 1.50 mg/dL   Calcium 9.2 8.4 - 10.5 mg/dL   GFR 114.11 >60.00 mL/min   Assessment & Plan:   Problem List Items Addressed This Visit    Health maintenance  examination - Primary    Preventative protocols reviewed and updated unless pt declined. Discussed healthy diet and lifestyle.        Other Visit Diagnoses    Screen for STD (sexually transmitted disease)       Relevant Orders   HIV Antibody (routine testing w rflx)   HSV Type I/II IgG, IgMw/ reflex   RPR       No orders of the defined types were placed in this encounter.  Orders Placed This Encounter  Procedures  . HIV Antibody (routine testing w rflx)    Standing Status:   Future    Standing Expiration Date:   11/19/2019  . HSV Type I/II IgG, IgMw/ reflex    Standing Status:   Future    Standing Expiration Date:   11/19/2019  . RPR    Standing Status:   Future    Standing Expiration Date:   11/19/2019    I discussed the assessment and treatment plan with the patient. The patient was provided an opportunity to ask questions and all were answered. The patient agreed with the plan and demonstrated an understanding of the instructions. The patient was advised to call back or seek an in-person evaluation if the symptoms worsen or if the condition fails to improve as anticipated.  Follow up plan: No follow-ups on file.  Ria Bush, MD

## 2018-11-19 NOTE — Addendum Note (Signed)
Addended by: Cloyd Stagers on: 11/19/2018 02:07 PM   Modules accepted: Orders

## 2018-11-20 LAB — C. TRACHOMATIS/N. GONORRHOEAE RNA
C. trachomatis RNA, TMA: NOT DETECTED
N. gonorrhoeae RNA, TMA: NOT DETECTED

## 2018-11-20 LAB — RPR: RPR Ser Ql: NONREACTIVE

## 2018-11-20 LAB — HIV ANTIBODY (ROUTINE TESTING W REFLEX): HIV 1&2 Ab, 4th Generation: NONREACTIVE

## 2018-11-21 ENCOUNTER — Telehealth: Payer: Self-pay

## 2018-11-21 NOTE — Telephone Encounter (Signed)
Pt left v/m requesting cb about recent test results.

## 2018-11-21 NOTE — Telephone Encounter (Signed)
Spoke with pt notifying him all labs are normal per Dr. Darnell Level and were sent via [place]. Pt verbalizes understanding. [See Labs result note, 11/19/18.]

## 2019-04-16 ENCOUNTER — Telehealth: Payer: Self-pay | Admitting: *Deleted

## 2019-04-16 NOTE — Telephone Encounter (Signed)
Patient called back stating that he wanted to talk with his doctor about something personal. Patient stated that he has concerns about ED and may be over thinking things. Patient scheduled for an appointment to see Dr. Danise Mina 04/17/19 and he would also like to get his flu shot. Negative covid screening.

## 2019-04-16 NOTE — Telephone Encounter (Signed)
Patient left a voicemail requesting a call back from his doctor.  Tried to call patient back and got his voicemail. Left a message for patient to call back.

## 2019-04-16 NOTE — Telephone Encounter (Signed)
Noted. Will see then.  

## 2019-04-17 ENCOUNTER — Other Ambulatory Visit: Payer: Self-pay

## 2019-04-17 ENCOUNTER — Ambulatory Visit: Payer: Managed Care, Other (non HMO) | Admitting: Family Medicine

## 2019-04-17 ENCOUNTER — Encounter: Payer: Self-pay | Admitting: Family Medicine

## 2019-04-17 VITALS — BP 112/68 | HR 78 | Temp 98.4°F | Ht 71.0 in | Wt 123.0 lb

## 2019-04-17 DIAGNOSIS — N529 Male erectile dysfunction, unspecified: Secondary | ICD-10-CM | POA: Insufficient documentation

## 2019-04-17 DIAGNOSIS — N528 Other male erectile dysfunction: Secondary | ICD-10-CM | POA: Diagnosis not present

## 2019-04-17 DIAGNOSIS — F439 Reaction to severe stress, unspecified: Secondary | ICD-10-CM | POA: Diagnosis not present

## 2019-04-17 DIAGNOSIS — Z23 Encounter for immunization: Secondary | ICD-10-CM | POA: Diagnosis not present

## 2019-04-17 MED ORDER — SILDENAFIL CITRATE 50 MG PO TABS: 25.0000 mg | ORAL_TABLET | Freq: Every day | ORAL | 0 refills | Status: DC | PRN

## 2019-04-17 NOTE — Assessment & Plan Note (Addendum)
Anticipate non-organic erectile dysfunction. Normal exam today. Discussed importance of stress relieving strategies to help manage stress. Trial short course of viagra to help confidence then anticipate will no longer need. Discussed to expect HA after use, aware cannot take nitrate with med, and aware to seek medical care if erection lasting longer than 4 hours.

## 2019-04-17 NOTE — Assessment & Plan Note (Addendum)
Support provided. Reviewed healthy stress relieving strategies. Update if worsening anxiety to consider further treatment/evaluation.

## 2019-04-17 NOTE — Patient Instructions (Addendum)
Flu shot today Work on healthy stress relieving strategies  Trial viagra as needed 1/2-1 tablet.  Update me with effect Let me know if ongoing stress or worsening anxiety.   Stress Stress is a normal reaction to life events. Stress is what you feel when life demands more than you are used to, or more than you think you can handle. Some stress can be useful, such as studying for a test or meeting a deadline at work. Stress that occurs too often or for too long can cause problems. It can affect your emotional health and interfere with relationships and normal daily activities. Too much stress can weaken your body's defense system (immune system) and increase your risk for physical illness. If you already have a medical problem, stress can make it worse. What are the causes? All sorts of life events can cause stress. An event that causes stress for one person may not be stressful for another person. Major life events, whether positive or negative, commonly cause stress. Examples include:  Losing a job or starting a new job.  Losing a loved one.  Moving to a new town or home.  Getting married or divorced.  Having a baby.  Injury or illness. Less obvious life events can also cause stress, especially if they occur day after day or in combination with each other. Examples include:  Working long hours.  Driving in traffic.  Caring for children.  Being in debt.  Being in a difficult relationship. What are the signs or symptoms? Stress can cause emotional symptoms, including:  Anxiety. This is feeling worried, afraid, on edge, overwhelmed, or out of control.  Anger, including irritation or impatience.  Depression. This is feeling sad, down, helpless, or guilty.  Trouble focusing, remembering, or making decisions. Stress can cause physical symptoms, including:  Aches and pains. These may affect your head, neck, back, stomach, or other areas of your body.  Tight muscles or a  clenched jaw.  Low energy.  Trouble sleeping. Stress can cause unhealthy behaviors, including:  Eating to feel better (overeating) or skipping meals.  Working too much or putting off tasks.  Smoking, drinking alcohol, or using drugs to feel better. How is this diagnosed? Stress is diagnosed through an assessment by your health care provider. He or she may diagnose this condition based on:  Your symptoms and any stressful life events.  Your medical history.  Tests to rule out other causes of your symptoms. Depending on your condition, your health care provider may refer you to a specialist for further evaluation. How is this treated?  Stress management techniques are the recommended treatment for stress. Medicine is not typically recommended for the treatment of stress. Techniques to reduce your reaction to stressful life events include:  Stress identification. Monitor yourself for symptoms of stress and identify what causes stress for you. These skills may help you to avoid or prepare for stressful events.  Time management. Set your priorities, keep a calendar of events, and learn to say "no." Taking these actions can help you avoid making too many commitments. Techniques for coping with stress include:  Rethinking the problem. Try to think realistically about stressful events rather than ignoring them or overreacting. Try to find the positives in a stressful situation rather than focusing on the negatives.  Exercise. Physical exercise can release both physical and emotional tension. The key is to find a form of exercise that you enjoy and do it regularly.  Relaxation techniques. These relax the body and  mind. The key is to find one or more that you enjoy and use the technique(s) regularly. Examples include: ? Meditation, deep breathing, or progressive relaxation techniques. ? Yoga or tai chi. ? Biofeedback, mindfulness techniques, or journaling. ? Listening to music, being out  in nature, or participating in other hobbies.  Practicing a healthy lifestyle. Eat a balanced diet, drink plenty of water, limit or avoid caffeine, and get plenty of sleep.  Having a strong support network. Spend time with family, friends, or other people you enjoy being around. Express your feelings and talk things over with someone you trust. Counseling or talk therapy with a mental health professional may be helpful if you are having trouble managing stress on your own. Follow these instructions at home: Lifestyle   Avoid drugs.  Do not use any products that contain nicotine or tobacco, such as cigarettes and e-cigarettes. If you need help quitting, ask your health care provider.  Limit alcohol intake to no more than 1 drink a day for nonpregnant women and 2 drinks a day for men. One drink equals 12 oz of beer, 5 oz of wine, or 1 oz of hard liquor.  Do not use alcohol or drugs to relax.  Eat a balanced diet that includes fresh fruits and vegetables, whole grains, lean meats, fish, eggs, and beans, and low-fat dairy. Avoid processed foods and foods high in added fat, sugar, and salt.  Exercise at least 30 minutes on 5 or more days each week.  Get 7-8 hours of sleep each night. General instructions   Practice stress management techniques as discussed with your health care provider.  Drink enough fluid to keep your urine clear or pale yellow.  Take over-the-counter and prescription medicines only as told by your health care provider.  Keep all follow-up visits as told by your health care provider. This is important. Contact a health care provider if:  Your symptoms get worse.  You have new symptoms.  You feel overwhelmed by your problems and can no longer manage them on your own. Get help right away if:  You have thoughts of hurting yourself or others. If you ever feel like you may hurt yourself or others, or have thoughts about taking your own life, get help right away.  You can go to your nearest emergency department or call:  Your local emergency services (911 in the [place]  A suicide crisis helpline, such as the [place] at 2283072468. This is open 24 hours a day. Summary  Stress is a normal reaction to life events. It can cause problems if it happens too often or for too long.  Practicing stress management techniques is the best way to treat stress.  Counseling or talk therapy with a mental health professional may be helpful if you are having trouble managing stress on your own. This information is not intended to replace advice given to you by your health care provider. Make sure you discuss any questions you have with your health care provider. Document Released: 11/15/2000 Document Revised: 05/04/2017 Document Reviewed: 07/12/2016 Elsevier Patient Education  2020 [employer].

## 2019-04-17 NOTE — Progress Notes (Signed)
This visit was conducted in person.  BP 112/68 (BP Location: Left Arm, Patient Position: Sitting, Cuff Size: Normal)   Pulse 78   Temp 98.4 F (36.9 C) (Temporal)   Ht 5\' 11"  (1.803 m)   Wt 123 lb (55.8 kg)   SpO2 96%   BMI 17.16 kg/m    CC: ?ED Subjective:    Patient ID: Leodis Alcocer, male    DOB: Sep 13, 1990, 28 y.o.   MRN: [id number]  HPI: Zaydrian Batta is a 28 y.o. male presenting on 04/17/2019 for Erectile Dysfunction (C/o troublel getting full erection.  Not sure when started. )   Not currently sexually active since February. Has started seeing someone new. Notes both trouble obtaining and maintaining erection which is concerning. Does not awaken with erections.   No abd pain, rash, dysuria or urethral discharge.   Wonders if stress related - significant work related stress. He recently bought house - financial stressors as well.   Not taking any meds, supplements, OTC meds.  He has cut down on caffeine. Trying to drink more water.   Some depressed mood. Decreased energy, concentration ok. Appetite ok. Not sleeping enough. No guilt feelings. No SI/HI.  Feels excessively worrying about things. Continues thinking about past relationship.   Alcohol - occasional  Non smoker.      Relevant past medical, surgical, family and social history reviewed and updated as indicated. Interim medical history since our last visit reviewed. Allergies and medications reviewed and updated. No outpatient medications prior to visit.   No facility-administered medications prior to visit.      Per HPI unless specifically indicated in ROS section below Review of Systems Objective:    BP 112/68 (BP Location: Left Arm, Patient Position: Sitting, Cuff Size: Normal)   Pulse 78   Temp 98.4 F (36.9 C) (Temporal)   Ht 5\' 11"  (1.803 m)   Wt 123 lb (55.8 kg)   SpO2 96%   BMI 17.16 kg/m   Wt Readings from Last 3 Encounters:  04/17/19 123 lb (55.8 kg)  11/19/18 130 lb (59 kg)   10/17/18 130 lb (59 kg)    Physical Exam Vitals signs and nursing note reviewed.  Constitutional:      General: He is not in acute distress.    Appearance: Normal appearance. He is not ill-appearing.  HENT:     Mouth/Throat:     Mouth: Mucous membranes are moist.     Pharynx: Oropharynx is clear. No posterior oropharyngeal erythema.  Neck:     Thyroid: No thyromegaly or thyroid tenderness.  Cardiovascular:     Rate and Rhythm: Normal rate and regular rhythm.     Pulses: Normal pulses.     Heart sounds: Normal heart sounds. No murmur.  Pulmonary:     Effort: Pulmonary effort is normal. No respiratory distress.     Breath sounds: Normal breath sounds. No wheezing, rhonchi or rales.  Abdominal:     Hernia: There is no hernia in the left inguinal area or right inguinal area.  Genitourinary:    Pubic Area: No rash.      Penis: Normal and circumcised. No erythema or discharge.      Scrotum/Testes: Normal.        Right: Mass, tenderness or swelling not present.        Left: Mass, tenderness or swelling not present.  Lymphadenopathy:     Lower Body: No right inguinal adenopathy. No left inguinal adenopathy.  Neurological:     Mental Status:  He is alert.  Psychiatric:        Mood and Affect: Mood normal.        Behavior: Behavior normal.       Results for orders placed or performed in visit on 11/19/18  C. trachomatis/N. gonorrhoeae RNA   Specimen: Urine  Result Value Ref Range   C. trachomatis RNA, TMA NOT DETECTED NOT DETECT   N. gonorrhoeae RNA, TMA NOT DETECTED NOT DETECT  RPR  Result Value Ref Range   RPR Ser Ql NON-REACTIVE NON-REACTI  HIV Antibody (routine testing w rflx)  Result Value Ref Range   HIV 1&2 Ab, 4th Generation NON-REACTIVE NON-REACTI  Basic metabolic panel  Result Value Ref Range   Sodium 136 135 - 145 mEq/L   Potassium 3.9 3.5 - 5.1 mEq/L   Chloride 100 96 - 112 mEq/L   CO2 29 19 - 32 mEq/L   Glucose, Bld 76 70 - 99 mg/dL   BUN 10 6 - 23 mg/dL    Creatinine, Ser 0.81 0.40 - 1.50 mg/dL   Calcium 9.3 8.4 - 10.5 mg/dL   GFR 115.27 >60.00 mL/min   Depression screen [facility] 2/9 04/17/2019 11/19/2018 06/20/2017 02/26/2015  Decreased Interest 0 0 0 0  Down, Depressed, Hopeless 0 0 0 0  PHQ - 2 Score 0 0 0 0  Altered sleeping 1 - - -  Tired, decreased energy 1 - - -  Change in appetite 0 - - -  Feeling bad or failure about yourself  1 - - -  Trouble concentrating 0 - - -  Moving slowly or fidgety/restless 0 - - -  Suicidal thoughts 0 - - -  PHQ-9 Score 3 - - -    GAD 7 : Generalized Anxiety Score 04/17/2019  Nervous, Anxious, on Edge 2  Control/stop worrying 1  Worry too much - different things 2  Trouble relaxing 3  Restless 1  Easily annoyed or irritable 2  Afraid - awful might happen 2  Total GAD 7 Score 13   Assessment & Plan:   Problem List Items Addressed This Visit    Stress    Support provided. Reviewed healthy stress relieving strategies. Update if worsening anxiety to consider further treatment/evaluation.       ED (erectile dysfunction) - Primary    Anticipate non-organic erectile dysfunction. Normal exam today. Discussed importance of stress relieving strategies to help manage stress. Trial short course of viagra to help confidence then anticipate will no longer need. Discussed to expect HA after use, aware cannot take nitrate with med, and aware to seek medical care if erection lasting longer than 4 hours.        Other Visit Diagnoses    Need for influenza vaccination       Relevant Orders   Flu Vaccine QUAD 36+ mos IM (Completed)       Meds ordered this encounter  Medications  . sildenafil (VIAGRA) 50 MG tablet    Sig: Take 0.5-1 tablets (25-50 mg total) by mouth daily as needed for erectile dysfunction.    Dispense:  5 tablet    Refill:  0   Orders Placed This Encounter  Procedures  . Flu Vaccine QUAD 36+ mos IM    Patient instructions: Flu shot today Work on healthy stress relieving strategies   Trial viagra as needed 1/2-1 tablet.  Update me with effect Let me know if ongoing stress or worsening anxiety.   Follow up plan: Return if symptoms worsen or fail to  improve.  Ria Bush, MD

## 2019-04-21 ENCOUNTER — Telehealth: Payer: Self-pay

## 2019-04-21 NOTE — Telephone Encounter (Signed)
Pt was seen on 04/17/19. Pt has taken viagra 1/2 tab and did not get erection until 4 hrs later. Pt is not sure if was med or natural erection. Pt has not had any side effects but pt does not like taking the viagra due to possible side effects. Pt said viagra is raising his stress level and he does not know what to expect from med. Pt request cb from Dr Darnell Level.

## 2019-04-21 NOTE — Telephone Encounter (Signed)
Spoke with pt about concerns  

## 2019-04-21 NOTE — Telephone Encounter (Signed)
Called left message   Will try again later

## 2019-10-23 ENCOUNTER — Telehealth: Payer: Self-pay

## 2019-10-23 NOTE — Telephone Encounter (Signed)
Pt called back and has no other symptoms other than H/A pain level 6-7 and lower back pain pain level 6-7. Pt got J&J covid vaccine on 10/22/19 at 1 PM at work and at (PM developed H/A and lower back pain. Pt said was not given any info or instructions after injection. Pt has not taken anything yet. Dr Reece Agar out of office and I spoke with Pamala Hurry NP who advised pt can take Ibuprofen 600 mg q8h prn for pain and if does not see improvement can go to UC for eval. No available appts at [facility]. Pt voiced understanding. FYI to Pamala Hurry NP.
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
## 2019-10-24 NOTE — Telephone Encounter (Signed)
Agree. Anticipate body ache side effect after vaccine.  plz call Friday for update.

## 2019-10-24 NOTE — Telephone Encounter (Signed)
Spoke with pt asking for an update on sxs.  States he is fine now.  Says the ibuprofen really helped and expresses his thanks for the advice.  FYI to Dr. Reece Agar.

## 2019-11-16 ENCOUNTER — Other Ambulatory Visit: Payer: Self-pay | Admitting: Family Medicine

## 2019-11-17 ENCOUNTER — Other Ambulatory Visit: Payer: Managed Care, Other (non HMO)

## 2019-11-24 ENCOUNTER — Encounter: Payer: Managed Care, Other (non HMO) | Admitting: Family Medicine

## 2019-12-05 ENCOUNTER — Ambulatory Visit (INDEPENDENT_AMBULATORY_CARE_PROVIDER_SITE_OTHER): Payer: Managed Care, Other (non HMO) | Admitting: Family Medicine

## 2019-12-05 ENCOUNTER — Other Ambulatory Visit: Payer: Self-pay

## 2019-12-05 ENCOUNTER — Encounter: Payer: Self-pay | Admitting: Family Medicine

## 2019-12-05 VITALS — BP 110/66 | HR 81 | Temp 97.9°F | Ht 67.25 in | Wt 124.1 lb

## 2019-12-05 DIAGNOSIS — Z113 Encounter for screening for infections with a predominantly sexual mode of transmission: Secondary | ICD-10-CM

## 2019-12-05 DIAGNOSIS — R5382 Chronic fatigue, unspecified: Secondary | ICD-10-CM | POA: Diagnosis not present

## 2019-12-05 DIAGNOSIS — Z0000 Encounter for general adult medical examination without abnormal findings: Secondary | ICD-10-CM

## 2019-12-05 LAB — CBC WITH DIFFERENTIAL/PLATELET
Basophils Absolute: 0 10*3/uL (ref 0.0–0.1)
Basophils Relative: 0.3 % (ref 0.0–3.0)
Eosinophils Absolute: 0 10*3/uL (ref 0.0–0.7)
Eosinophils Relative: 0.8 % (ref 0.0–5.0)
HCT: 46.7 % (ref 39.0–52.0)
Hemoglobin: 15.5 g/dL (ref 13.0–17.0)
Lymphocytes Relative: 24.6 % (ref 12.0–46.0)
Lymphs Abs: 1.4 10*3/uL (ref 0.7–4.0)
MCHC: 33.1 g/dL (ref 30.0–36.0)
MCV: 94.7 fl (ref 78.0–100.0)
Monocytes Absolute: 0.5 10*3/uL (ref 0.1–1.0)
Monocytes Relative: 8.2 % (ref 3.0–12.0)
Neutro Abs: 3.7 10*3/uL (ref 1.4–7.7)
Neutrophils Relative %: 66.1 % (ref 43.0–77.0)
Platelets: 229 10*3/uL (ref 150.0–400.0)
RBC: 4.93 Mil/uL (ref 4.22–5.81)
RDW: 13 % (ref 11.5–15.5)
WBC: 5.6 10*3/uL (ref 4.0–10.5)

## 2019-12-05 LAB — COMPREHENSIVE METABOLIC PANEL
ALT: 11 U/L (ref 0–53)
AST: 19 U/L (ref 0–37)
Albumin: 4.7 g/dL (ref 3.5–5.2)
Alkaline Phosphatase: 71 U/L (ref 39–117)
BUN: 14 mg/dL (ref 6–23)
CO2: 29 mEq/L (ref 19–32)
Calcium: 9.4 mg/dL (ref 8.4–10.5)
Chloride: 103 mEq/L (ref 96–112)
Creatinine, Ser: 0.96 mg/dL (ref 0.40–1.50)
GFR: 93.99 mL/min (ref >60.00)
Glucose, Bld: 82 mg/dL (ref 70–99)
Potassium: 4 mEq/L (ref 3.5–5.1)
Sodium: 138 mEq/L (ref 135–145)
Total Bilirubin: 0.5 mg/dL (ref 0.2–1.2)
Total Protein: 7.5 g/dL (ref 6.0–8.3)

## 2019-12-05 LAB — TSH: TSH: 2.57 u[IU]/mL (ref 0.35–4.50)

## 2019-12-05 LAB — VITAMIN B12: Vitamin B-12: 263 pg/mL (ref 211–911)

## 2019-12-05 NOTE — Progress Notes (Addendum)
This visit was conducted in person.  BP 110/66 (BP Location: Left Arm, Patient Position: Sitting, Cuff Size: Normal)   Pulse 81   Temp 97.9 F (36.6 C) (Temporal)   Ht 5' 7.25" (1.708 m)   Wt 124 lb 1 oz (56.3 kg)   SpO2 98%   BMI 19.29 kg/m    CC: CPE Subjective:    Patient ID: Franklin Curry, male    DOB: 07-Nov-1990, 29 y.o.   MRN: [id number]  HPI: Franklin Curry is a 29 y.o. male presenting on 12/05/2019 for Annual Exam (C/o fatigue.  Requests HGB included with lab work. )   New dog and house.   Notes increasing fatigue over the past year - hits him at 8p. 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 Bedtime is 2-3am, wakes up at 8am. 5-6 hours of sleep. Only 2 cups of coffee, then several cups of soda daily. No energy drinks. Tries to choose healthy foods.   Preventative: Flu shotyearly Tdap - 12/2007, Td 2020 COVID vaccine - J&J 10/2019  Seat belt use discussed  Sunscreen use discussed. No changing moles on skin Non smoker  Alcohol - occasional - 1 mixed drink and beer on weekend  Not currently sexually active. 2 partners in the past year, uses protection.  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 Interim medical history since our last visit reviewed. Allergies and medications reviewed and updated. Outpatient Medications Prior to Visit  Medication Sig Dispense Refill  . sildenafil (VIAGRA) 50 MG tablet Take 0.5-1 tablets (25-50 mg total) by mouth daily as needed for erectile dysfunction. 5 tablet 0   No facility-administered medications prior to visit.     Per HPI unless specifically indicated in ROS section below Review of Systems    Constitutional: Negative for activity change, appetite change, chills, fatigue, fever and unexpected weight change.  HENT: Negative for hearing loss.   Eyes: Negative for visual disturbance.  Respiratory: Negative for cough, chest tightness, shortness of breath and wheezing.   Cardiovascular: Negative for chest pain, palpitations and leg swelling.  Gastrointestinal: Negative for abdominal distention, abdominal pain, blood in stool, constipation, diarrhea, nausea and vomiting.  Genitourinary: Negative for difficulty urinating and hematuria.  Musculoskeletal: Negative for arthralgias, myalgias and neck pain.  Skin: Negative for rash.  Neurological: Negative for dizziness, seizures, syncope and headaches.  Hematological: Negative for adenopathy. Does not bruise/bleed easily.  Psychiatric/Behavioral: Negative for dysphoric mood. The patient is not nervous/anxious.    Objective:  BP 110/66 (BP Location: Left Arm, Patient Position: Sitting, Cuff Size: Normal)   Pulse 81   Temp 97.9 F (36.6 C) (Temporal)   Ht 5' 7.25" (1.708 m)   Wt 124 lb 1 oz (56.3 kg)   SpO2 98%   BMI 19.29 kg/m   Wt Readings from Last 3 Encounters:  12/05/19 124 lb 1 oz (56.3 kg)  04/17/19 123 lb (55.8 kg)  11/19/18 130 lb (59 kg)      Physical Exam Vitals and nursing note reviewed.  Constitutional:      General: He is not in acute distress.    Appearance: Normal appearance. He is well-developed. He is not ill-appearing.  HENT:     Head: Normocephalic and atraumatic.     Right Ear: Hearing, tympanic membrane, ear canal and external ear normal.     Left Ear: Hearing, tympanic membrane, ear canal and external ear normal.  Eyes:     General: No scleral icterus.    Extraocular Movements: Extraocular movements intact.     Conjunctiva/sclera: Conjunctivae normal.     Pupils: Pupils are equal, round, and reactive to light.  Cardiovascular:     Rate and Rhythm: Normal rate and regular rhythm.     Pulses: Normal  pulses.          Radial pulses are 2+ on the right side and 2+ on the left side.     Heart sounds: Normal heart sounds. No murmur heard.   Pulmonary:     Effort: Pulmonary effort is normal. No respiratory distress.     Breath sounds: Normal breath sounds. No wheezing, rhonchi or rales.  Abdominal:     General: Abdomen is flat. Bowel sounds are normal. There is no distension.     Palpations: Abdomen is soft. There is no mass.     Tenderness: There is no abdominal tenderness. There is no guarding or rebound.     Hernia: No hernia is present.  Musculoskeletal:        General: Normal range of motion.     Cervical back: Normal range of motion and neck supple.     Right lower leg: No edema.     Left lower leg: No edema.  Lymphadenopathy:     Cervical: No cervical adenopathy.  Skin:    General: Skin is warm and dry.     Findings: No rash.  Neurological:     General: No focal deficit present.     Mental Status: He is alert and oriented to person, place, and time.     Comments: CN grossly intact, station and gait intact  Psychiatric:        Mood and Affect: Mood normal.        Behavior: Behavior normal.        Thought Content: Thought content normal.        Judgment: Judgment normal.       Results for orders placed or performed in visit on 11/19/18  C. trachomatis/N. gonorrhoeae RNA   Specimen: Urine  Result Value Ref Range   C. trachomatis RNA, TMA NOT DETECTED NOT DETECT   N. gonorrhoeae RNA, TMA NOT DETECTED NOT DETECT  RPR  Result Value Ref Range   RPR Ser Ql NON-REACTIVE NON-REACTI  HIV Antibody (routine testing w rflx)  Result Value Ref Range   HIV 1&2 Ab, 4th Generation NON-REACTIVE NON-REACTI  Basic metabolic panel  Result Value Ref Range   Sodium 136 135 - 145 mEq/L   Potassium 3.9 3.5 - 5.1 mEq/L   Chloride 100 96 - 112 mEq/L   CO2 29 19 - 32 mEq/L   Glucose, Bld 76 70 - 99 mg/dL   BUN 10 6 - 23 mg/dL   Creatinine, Ser 4.33 0.40 - 1.50 mg/dL   Calcium 9.3 8.4 -  29.5 mg/dL   GFR 188.41 >66.06 mL/min   Assessment & Plan:  This visit occurred during the SARS-CoV-2 public health emergency.  Safety protocols were in place, including screening questions prior to the visit, additional usage of staff PPE, and extensive cleaning of exam room while observing appropriate contact time as indicated for disinfecting solutions.   Problem List Items Addressed This Visit  Health maintenance examination - Primary    Preventative protocols reviewed and updated unless pt declined. Discussed healthy diet and lifestyle.        Other Visit Diagnoses    Chronic fatigue       Relevant Orders   Comprehensive metabolic panel   TSH   CBC with Differential/Platelet   Vitamin B12   Screen for STD (sexually transmitted disease)       Relevant Orders   HIV Antibody (routine testing w rflx)   RPR   C. trachomatis/N. gonorrhoeae RNA       No orders of the defined types were placed in this encounter.  Orders Placed This Encounter  Procedures  . C. trachomatis/N. gonorrhoeae RNA  . Comprehensive metabolic panel  . TSH  . CBC with Differential/Platelet  . Vitamin B12  . HIV Antibody (routine testing w rflx)  . RPR    Patient instructions: Labs today Work on sleep routine - at least 7-8 hours every night.  We will be in touch with results.  Schedule physical in 1 year.  Follow up plan: Return in about 1 year (around 12/04/2020).  Eustaquio Boyden, MD

## 2019-12-05 NOTE — Patient Instructions (Addendum)
Labs today Work on sleep routine - at least 7-8 hours every night.  We will be in touch with results.  Schedule physical in 1 year.   Health Maintenance, Male Adopting a healthy lifestyle and getting preventive care are important in promoting health and wellness. Ask your health care provider about:  The right schedule for you to have regular tests and exams.  Things you can do on your own to prevent diseases and keep yourself healthy. What should I know about diet, weight, and exercise? Eat a healthy diet   Eat a diet that includes plenty of vegetables, fruits, low-fat dairy products, and lean protein.  Do not eat a lot of foods that are high in solid fats, added sugars, or sodium. Maintain a healthy weight Body mass index (BMI) is a measurement that can be used to identify possible weight problems. It estimates body fat based on height and weight. Your health care provider can help determine your BMI and help you achieve or maintain a healthy weight. Get regular exercise Get regular exercise. This is one of the most important things you can do for your health. Most adults should:  Exercise for at least 150 minutes each week. The exercise should increase your heart rate and make you sweat (moderate-intensity exercise).  Do strengthening exercises at least twice a week. This is in addition to the moderate-intensity exercise.  Spend less time sitting. Even light physical activity can be beneficial. Watch cholesterol and blood lipids Have your blood tested for lipids and cholesterol at 27 years of age, then have this test every 5 years. You may need to have your cholesterol levels checked more often if:  Your lipid or cholesterol levels are high.  You are older than 27 years of age.  You are at high risk for heart disease. What should I know about cancer screening? Many types of cancers can be detected early and may often be prevented. Depending on your health history and family  history, you may need to have cancer screening at various ages. This may include screening for:  Colorectal cancer.  Prostate cancer.  Skin cancer.  Lung cancer. What should I know about heart disease, diabetes, and high blood pressure? Blood pressure and heart disease  High blood pressure causes heart disease and increases the risk of stroke. This is more likely to develop in people who have high blood pressure readings, are of African descent, or are overweight.  Talk with your health care provider about your target blood pressure readings.  Have your blood pressure checked: ? Every 3-5 years if you are 82-87 years of age. ? Every year if you are 83 years old or older.  If you are between the ages of 15 and 80 and are a current or former smoker, ask your health care provider if you should have a one-time screening for abdominal aortic aneurysm (AAA). Diabetes Have regular diabetes screenings. This checks your fasting blood sugar level. Have the screening done:  Once every three years after age 71 if you are at a normal weight and have a low risk for diabetes.  More often and at a younger age if you are overweight or have a high risk for diabetes. What should I know about preventing infection? Hepatitis B If you have a higher risk for hepatitis B, you should be screened for this virus. Talk with your health care provider to find out if you are at risk for hepatitis B infection. Hepatitis C Blood testing is  recommended for:  Everyone born from [birth year] through 1963.  Anyone with known risk factors for hepatitis C. Sexually transmitted infections (STIs)  You should be screened each year for STIs, including gonorrhea and chlamydia, if: ? You are sexually active and are younger than 27 years of age. ? You are older than 27 years of age and your health care provider tells you that you are at risk for this type of infection. ? Your sexual activity has changed since you were last  screened, and you are at increased risk for chlamydia or gonorrhea. Ask your health care provider if you are at risk.  Ask your health care provider about whether you are at high risk for HIV. Your health care provider may recommend a prescription medicine to help prevent HIV infection. If you choose to take medicine to prevent HIV, you should first get tested for HIV. You should then be tested every 3 months for as long as you are taking the medicine. Follow these instructions at home: Lifestyle  Do not use any products that contain nicotine or tobacco, such as cigarettes, e-cigarettes, and chewing tobacco. If you need help quitting, ask your health care provider.  Do not use street drugs.  Do not share needles.  Ask your health care provider for help if you need support or information about quitting drugs. Alcohol use  Do not drink alcohol if your health care provider tells you not to drink.  If you drink alcohol: ? Limit how much you have to 0-2 drinks a day. ? Be aware of how much alcohol is in your drink. In the [place] one drink equals one 12 oz bottle of beer (355 mL), one 5 oz glass of wine (148 mL), or one 1 oz glass of hard liquor (44 mL). General instructions  Schedule regular health, dental, and eye exams.  Stay current with your vaccines.  Tell your health care provider if: ? You often feel depressed. ? You have ever been abused or do not feel safe at home. Summary  Adopting a healthy lifestyle and getting preventive care are important in promoting health and wellness.  Follow your health care provider's instructions about healthy diet, exercising, and getting tested or screened for diseases.  Follow your health care provider's instructions on monitoring your cholesterol and blood pressure. This information is not intended to replace advice given to you by your health care provider. Make sure you discuss any questions you have with your health care provider. Document  Revised: 05/15/2018 Document Reviewed: 05/15/2018 Elsevier Patient Education  2020 [employer].

## 2019-12-05 NOTE — Assessment & Plan Note (Signed)
Preventative protocols reviewed and updated unless pt declined. Discussed healthy diet and lifestyle.  

## 2019-12-09 LAB — HIV ANTIBODY (ROUTINE TESTING W REFLEX): HIV 1&2 Ab, 4th Generation: NONREACTIVE

## 2019-12-09 LAB — RPR: RPR Ser Ql: NONREACTIVE

## 2019-12-09 LAB — C. TRACHOMATIS/N. GONORRHOEAE RNA
C. trachomatis RNA, TMA: NOT DETECTED
N. gonorrhoeae RNA, TMA: NOT DETECTED

## 2019-12-22 ENCOUNTER — Telehealth: Payer: Self-pay | Admitting: Family Medicine

## 2019-12-22 NOTE — Telephone Encounter (Signed)
Pt aware.

## 2019-12-22 NOTE — Telephone Encounter (Signed)
Lvm asking pt to call office. Received a request for pt to receive a copy of labs from 7/2 visit. These have been printed off and placed at the front in the yellow folders for pickup.

## 2020-06-21 ENCOUNTER — Ambulatory Visit: Payer: Managed Care, Other (non HMO) | Admitting: Family Medicine

## 2020-06-22 ENCOUNTER — Ambulatory Visit: Payer: Managed Care, Other (non HMO)

## 2020-06-28 ENCOUNTER — Ambulatory Visit: Payer: Managed Care, Other (non HMO) | Admitting: Family Medicine

## 2020-06-28 ENCOUNTER — Telehealth: Payer: Self-pay

## 2020-06-28 DIAGNOSIS — Z0289 Encounter for other administrative examinations: Secondary | ICD-10-CM

## 2020-06-28 NOTE — Telephone Encounter (Signed)
[facility] Night - Client Nonclinical Telephone Record  [facility] Client [place] [facility] Night - Client Client Site  [facility] [place] - Night Physician Eustaquio Boyden - MD Contact Type Call Who Is Calling Patient / Member / Family / Caregiver Caller Name Franklin Curry Caller Phone Number [phone] Patient Name Franklin Curry Patient DOB 1990-12-02 Call Type Message Only Information Provided Reason for Call Request to [facility] Appointment Initial Comment Caller states he need to cancel his appointment. Disp. Time Disposition Final User 06/28/2020 7:37:27 AM General Information Provided Yes Darrel Hoover Call Closed By: Darrel Hoover Transaction Date/Time: 06/28/2020 7:35:34 AM (ET)

## 2020-08-02 ENCOUNTER — Other Ambulatory Visit: Payer: Self-pay | Admitting: Family Medicine

## 2020-08-02 ENCOUNTER — Other Ambulatory Visit (INDEPENDENT_AMBULATORY_CARE_PROVIDER_SITE_OTHER): Payer: Managed Care, Other (non HMO)

## 2020-08-02 ENCOUNTER — Other Ambulatory Visit: Payer: Self-pay

## 2020-08-02 DIAGNOSIS — Z131 Encounter for screening for diabetes mellitus: Secondary | ICD-10-CM

## 2020-08-02 DIAGNOSIS — E538 Deficiency of other specified B group vitamins: Secondary | ICD-10-CM | POA: Insufficient documentation

## 2020-08-02 LAB — BASIC METABOLIC PANEL
BUN: 15 mg/dL (ref 6–23)
CO2: 27 mEq/L (ref 19–32)
Calcium: 9.1 mg/dL (ref 8.4–10.5)
Chloride: 104 mEq/L (ref 96–112)
Creatinine, Ser: 0.96 mg/dL (ref 0.40–1.50)
GFR: 108.24 mL/min (ref >60.00)
Glucose, Bld: 89 mg/dL (ref 70–99)
Potassium: 3.8 mEq/L (ref 3.5–5.1)
Sodium: 138 mEq/L (ref 135–145)

## 2020-08-02 LAB — VITAMIN B12: Vitamin B-12: 213 pg/mL (ref 211–911)

## 2020-08-09 ENCOUNTER — Encounter: Payer: Managed Care, Other (non HMO) | Admitting: Family Medicine

## 2020-11-29 ENCOUNTER — Telehealth (INDEPENDENT_AMBULATORY_CARE_PROVIDER_SITE_OTHER): Payer: Managed Care, Other (non HMO) | Admitting: Family Medicine

## 2020-11-29 ENCOUNTER — Encounter: Payer: Self-pay | Admitting: Family Medicine

## 2020-11-29 ENCOUNTER — Telehealth: Payer: Self-pay | Admitting: *Deleted

## 2020-11-29 VITALS — Ht 67.25 in | Wt 128.0 lb

## 2020-11-29 DIAGNOSIS — K219 Gastro-esophageal reflux disease without esophagitis: Secondary | ICD-10-CM

## 2020-11-29 DIAGNOSIS — E538 Deficiency of other specified B group vitamins: Secondary | ICD-10-CM

## 2020-11-29 NOTE — Progress Notes (Signed)
Patient ID: Franklin Curry, male    DOB: 01/20/1991, 30 y.o.   MRN: [id number]  Virtual visit completed through MyChart, a video enabled telemedicine application. Due to national recommendations of social distancing due to COVID-19, a virtual visit is felt to be most appropriate for this patient at this time. Reviewed limitations, risks, security and privacy concerns of performing a virtual visit and the availability of in person appointments. I also reviewed that there may be a patient responsible charge related to this service. The patient agreed to proceed.   Patient location: home Provider location: [place] at [facility], office Persons participating in this virtual visit: patient, provider   If any vitals were documented, they were collected by patient at home unless specified below.    Ht 5' 7.25" (1.708 m)   Wt 128 lb (58.1 kg)   BMI 19.90 kg/m    CC: discuss reflux Subjective:   HPI: Franklin Curry is a 30 y.o. male presenting on 11/29/2020 for Gastroesophageal Reflux   Recently saw dentist, told he had signs of acid reflux on his teeth enamel. Saw [facility]. He is planning to see Q6 months. Planning dental work for cavities and chipped teeth.   He endorses some GERD symptoms during COVID but not really since. Was working 115 hours/wk during COVID - at [employer] - now back down to 50 hrs/wk. No heartburn symptoms over the last 9 months.  "Agida".   Caffeine - 1 cup coffee/day. Recently started cutting out sodas, down to 2/day. Some sweet tea.  Avoids spicy foods.  Drinking plenty of water.  Likes mints.  NSAIDs - avoids.  Alcohol - 6 pack a week.  Non smoker.   No abd pain, trouble swallowing, early satiety, nausea/vomiting.   By the way -      Relevant past medical, surgical, family and social history reviewed and updated as indicated. Interim medical history since our last visit reviewed. Allergies and medications reviewed and updated. No outpatient  medications prior to visit.   No facility-administered medications prior to visit.     Per HPI unless specifically indicated in ROS section below Review of Systems Objective:  Ht 5' 7.25" (1.708 m)   Wt 128 lb (58.1 kg)   BMI 19.90 kg/m   Wt Readings from Last 3 Encounters:  11/29/20 128 lb (58.1 kg)  12/05/19 124 lb 1 oz (56.3 kg)  04/17/19 123 lb (55.8 kg)       Physical exam: Gen: alert, NAD, not ill appearing Pulm: speaks in complete sentences without increased work of breathing Psych: normal mood, normal thought content      Results for orders placed or performed in visit on 08/02/20  Basic metabolic panel  Result Value Ref Range   Sodium 138 135 - 145 mEq/L   Potassium 3.8 3.5 - 5.1 mEq/L   Chloride 104 96 - 112 mEq/L   CO2 27 19 - 32 mEq/L   Glucose, Bld 89 70 - 99 mg/dL   BUN 15 6 - 23 mg/dL   Creatinine, Ser 0.25 0.40 - 1.50 mg/dL   GFR 852.77 >82.42 mL/min   Calcium 9.1 8.4 - 10.5 mg/dL  Vitamin P53  Result Value Ref Range   Vitamin B-12 213 211 - 911 pg/mL   Assessment & Plan:   Problem List Items Addressed This Visit     Low serum vitamin B12    rec he start B12 supplement daily.        GERD (gastroesophageal reflux  disease) - Primary    Per recent [facility] eval evidence of acid damage to dental enamel however pt denies any heartburn symptoms at this time. He did have more GERD symptoms earlier during COVID when caffeine intake was much higher but that has since resolved. He has already implemented healthy diet changes to help control this. Reviewed dietary measures for control, rec limit alcohol and caffeine and avoid NSAIDs. Handout provided.          No orders of the defined types were placed in this encounter.  No orders of the defined types were placed in this encounter.   I discussed the assessment and treatment plan with the patient. The patient was provided an opportunity to ask questions and all were answered. The patient agreed  with the plan and demonstrated an understanding of the instructions. The patient was advised to call back or seek an in-person evaluation if the symptoms worsen or if the condition fails to improve as anticipated.  Follow up plan: No follow-ups on file.  Eustaquio Boyden, MD

## 2020-11-29 NOTE — Telephone Encounter (Signed)
PLEASE NOTE: All timestamps contained within this report are represented as [place] Standard Time. CONFIDENTIALTY NOTICE: This fax transmission is intended only for the addressee. It contains information that is legally privileged, confidential or otherwise protected from use or disclosure. If you are not the intended recipient, you are strictly prohibited from reviewing, disclosing, copying using or disseminating any of this information or taking any action in reliance on or regarding this information. If you have received this fax in error, please notify us immediately by telephone so that we can arrange for its return to [place]. Phone: [phone], Toll-Free: [phone], Fax: [phone] Page: 1 of 1 Call Id: [id number] [facility] [facility] Night - Client Nonclinical Telephone Record  [facility] Client [place] [facility] Night - Client Client Site [place] [facility] [place] - Night Physician Eustaquio Boyden - MD Contact Type Call Who Is Calling Patient / Member / Family / Caregiver Caller Name Tomie Elko Caller Phone Number [phone] Patient Name Franklin Curry Patient DOB 1991/02/09 Call Type Message Only Information Provided Reason for Call Request for General Office Information Initial Comment Caller needs appointment 8 to be a phone appointment. Additional Comment Office hours provided. Disp. Time Disposition Final User 11/29/2020 7:03:05 AM General Information Provided Yes Tessa Lerner Call Closed By: Tessa Lerner Transaction Date/Time: 11/29/2020 7:00:13 AM (ET)

## 2020-11-29 NOTE — Assessment & Plan Note (Signed)
rec he start B12 supplement daily.

## 2020-11-29 NOTE — Assessment & Plan Note (Signed)
Per recent [facility] eval evidence of acid damage to dental enamel however pt denies any heartburn symptoms at this time. He did have more GERD symptoms earlier during COVID when caffeine intake was much higher but that has since resolved. He has already implemented healthy diet changes to help control this. Reviewed dietary measures for control, rec limit alcohol and caffeine and avoid NSAIDs. Handout provided.

## 2021-03-28 ENCOUNTER — Other Ambulatory Visit: Payer: Self-pay

## 2021-03-28 ENCOUNTER — Ambulatory Visit: Payer: Managed Care, Other (non HMO) | Admitting: Nurse Practitioner

## 2021-03-28 ENCOUNTER — Telehealth: Payer: Managed Care, Other (non HMO) | Admitting: Nurse Practitioner

## 2021-03-28 VITALS — BP 96/62 | HR 60 | Temp 98.5°F | Resp 14 | Wt 129.1 lb

## 2021-03-28 DIAGNOSIS — L237 Allergic contact dermatitis due to plants, except food: Secondary | ICD-10-CM

## 2021-03-28 MED ORDER — PREDNISONE 20 MG PO TABS: ORAL_TABLET | ORAL | 0 refills | Status: AC

## 2021-03-28 NOTE — Patient Instructions (Signed)
Nice to see you today Take the prednisone. If it spreads or get worse let us know.

## 2021-03-28 NOTE — Assessment & Plan Note (Signed)
Given length of symptoms and percentage of body area covered with patient will like to treat with oral steroids.  Did tell patient we will do a 10-day course in order to get it to resolve and hopefully stay calm.  Patient has no airway involvement mouth and oropharynx looks clear no labored breathing or shortness of breath these things were discussed at the start developing the patient needs to be seen emergently in emergency department.  He understood.  Did also discuss bathing about tepid cool water versus hot water not scrubbing those areas but patting them dry and being gentle with the soap.  Patient knowledge understood continue to monitor return if symptoms fail to improve or worsen.

## 2021-03-28 NOTE — Progress Notes (Signed)
Acute Office Visit  Subjective:    Patient ID: Franklin Curry, male    DOB: 1990-07-13, 30 y.o.   MRN: [id number]  Chief Complaint  Patient presents with   Rash    Noticed it on 03/22/21- red, raised, itchy. Rash is present on arms, fingers, neck, chest, stomach, left heel, inner left thigh. Patient cut down a tree and burnt it on 03/21/21 and did notice a rash until next day.    HPI Patient is in today for poison oak  Benadryl and benadryl spray along with calamine wihtout relief.  Does have excoriation to where he has scratched his left wrist  States that he cut tree down. Burnt tree and developed rash the next day. States that he did not see poison oak/ivy. He was wearing a shirt but but it was a cut off tank top and patient was leaning into the tree with his chest.   States that shower feels good but is overwhelming.   Past Medical History:  Diagnosis Date   Allergic rhinitis    History of attention deficit disorder    prior on adderall and vyvanse, now off all meds; neuro eval from 2004 scanned into chart   MVP (mitral valve prolapse) 2006   minor    No past surgical history on file.  Family History  Problem Relation Age of Onset   Arthritis Maternal Grandmother    Arthritis Maternal Grandfather    Diabetes Maternal Grandfather    Cancer Other        both grandmothers, unsure type   CAD Neg Hx    Stroke Neg Hx     Social History   Socioeconomic History   Marital status: Unknown    Spouse name: Not on file   Number of children: Not on file   Years of education: Not on file   Highest education level: Not on file  Occupational History   Not on file  Tobacco Use   Smoking status: Never   Smokeless tobacco: Former    Types: Snuff    Quit date: 03/17/2016   Tobacco comments:    1 can/wk  Substance and Sexual Activity   Alcohol use: Yes    Alcohol/week: 0.0 standard drinks    Comment: occasional   Drug use: No   Sexual activity: Not on file   Other Topics Concern   Not on file  Social History Narrative   Lives with parents, and in firehouse   Occupation: Passenger transport manager at [employer], Engineer, water   Edu: HS   Activity: active at work   Diet: good water, no fruits/vegetables    Social Determinants of Corporate investment banker Strain: Not on file  Food Insecurity: Not on file  Transportation Needs: Not on file  Physical Activity: Not on file  Stress: Not on file  Social Connections: Not on file  Intimate Partner Violence: Not on file    No outpatient medications prior to visit.   No facility-administered medications prior to visit.    No Known Allergies  Review of Systems  Constitutional:  Negative for chills and fever.  HENT:  Negative for sore throat, trouble swallowing and voice change.   Eyes:  Negative for pain, itching and visual disturbance.  Respiratory:  Negative for cough, choking, chest tightness and shortness of breath.   Cardiovascular:  Negative for chest pain.  Gastrointestinal:  Negative for diarrhea, nausea and vomiting.  Skin:  Positive for color change and rash.  Objective:    Physical Exam Vitals and nursing note reviewed.  Constitutional:      Appearance: Normal appearance.  HENT:     Mouth/Throat:     Mouth: Mucous membranes are moist.     Pharynx: Oropharynx is clear.  Eyes:     Conjunctiva/sclera: Conjunctivae normal.  Cardiovascular:     Rate and Rhythm: Normal rate and regular rhythm.  Pulmonary:     Effort: Pulmonary effort is normal.     Breath sounds: Normal breath sounds.  Skin:    General: Skin is warm.     Findings: Rash present. Rash is macular and papular.          Comments: Diffuse maculopapular rash to left side of chest and left wrist  Neurological:     Mental Status: He is alert.      BP 96/62   Pulse 60   Temp 98.5 F (36.9 C)   Resp 14   Wt 129 lb 2 oz (58.6 kg)   BMI 20.07 kg/m  Wt Readings from Last 3  Encounters:  03/28/21 129 lb 2 oz (58.6 kg)  11/29/20 128 lb (58.1 kg)  12/05/19 124 lb 1 oz (56.3 kg)    Health Maintenance Due  Topic Date Due   Hepatitis C Screening  Never done   COVID-19 Vaccine (2 - Booster for Janssen series) 12/17/2019   INFLUENZA VACCINE  01/03/2021    There are no preventive care reminders to display for this patient.   Lab Results  Component Value Date   TSH 2.57 12/05/2019   Lab Results  Component Value Date   WBC 5.6 12/05/2019   HGB 15.5 12/05/2019   HCT 46.7 12/05/2019   MCV 94.7 12/05/2019   PLT 229.0 12/05/2019   Lab Results  Component Value Date   NA 138 08/02/2020   K 3.8 08/02/2020   CO2 27 08/02/2020   GLUCOSE 89 08/02/2020   BUN 15 08/02/2020   CREATININE 0.96 08/02/2020   BILITOT 0.5 12/05/2019   ALKPHOS 71 12/05/2019   AST 19 12/05/2019   ALT 11 12/05/2019   PROT 7.5 12/05/2019   ALBUMIN 4.7 12/05/2019   CALCIUM 9.1 08/02/2020   GFR 108.24 08/02/2020   Lab Results  Component Value Date   CHOL 146 06/20/2017   Lab Results  Component Value Date   HDL 62.60 06/20/2017   Lab Results  Component Value Date   LDLCALC 74 06/20/2017   Lab Results  Component Value Date   TRIG 48.0 06/20/2017   Lab Results  Component Value Date   CHOLHDL 2 06/20/2017   No results found for: HGBA1C     Assessment & Plan:   Problem List Items Addressed This Visit       Musculoskeletal and Integument   Poison oak dermatitis - Primary    Given length of symptoms and percentage of body area covered with patient will like to treat with oral steroids.  Did tell patient we will do a 10-day course in order to get it to resolve and hopefully stay calm.  Patient has no airway involvement mouth and oropharynx looks clear no labored breathing or shortness of breath these things were discussed at the start developing the patient needs to be seen emergently in emergency department.  He understood.  Did also discuss bathing about tepid cool  water versus hot water not scrubbing those areas but patting them dry and being gentle with the soap.  Patient knowledge understood continue to monitor return  if symptoms fail to improve or worsen.      Relevant Medications   predniSONE (DELTASONE) 20 MG tablet     No orders of the defined types were placed in this encounter.  This visit occurred during the SARS-CoV-2 public health emergency.  Safety protocols were in place, including screening questions prior to the visit, additional usage of staff PPE, and extensive cleaning of exam room while observing appropriate contact time as indicated for disinfecting solutions.   Audria Nine, NP

## 2021-05-25 ENCOUNTER — Telehealth: Payer: Self-pay

## 2021-05-25 NOTE — Telephone Encounter (Signed)
[facility] - Client TELEPHONE ADVICE RECORD [facility] Patient Name: Franklin Curry Gender: Male DOB: [birth year] Age: 30 Y 5 M 3 D Return Phone Number: [phone] (Primary) Address: City/ State/ Zip: [place]  [address] Client [facility] - Client Client Site [facility] - Day Provider Eustaquio Boyden - MD Contact Type Call Who Is Calling Patient / Member / Family / Caregiver Call Type Triage / Clinical Relationship To Patient Self Return Phone Number [phone] (Primary) Chief Complaint Chickenpox Reason for Call Symptomatic / Request for Health Information Initial Comment caller states that he may have chickenpox, raised bumps on neck, back and chest. No appointments at [facility] for today. No pain, itching or burning. More noticable after hot shower. Translation No Nurse Assessment Nurse: Daphine Deutscher, RN, Melanie Date/Time Lamount Cohen Time): 05/25/2021 2:19:12 PM Confirm and document reason for call. If symptomatic, describe symptoms. ---Caller states he has some bumps on the back of his neck and back and chest. Also on his shoulder back and front. They do not itch, no fever. Just noticed today and has had no exposure to monkeypox. Does the patient have any new or worsening symptoms? ---Yes Will a triage be completed? ---Yes Related visit to physician within the last 2 weeks? ---No Does the PT have any chronic conditions? (i.e. diabetes, asthma, this includes High risk factors for pregnancy, etc.) ---No Is this a behavioral health or substance abuse call? ---No Guidelines Guideline Title Affirmed Question Affirmed Notes Nurse Date/Time (Eastern Time) Rash or Redness - Widespread Mild widespread rash Andrey Cota, Shawna Orleans 05/25/2021 2:22:19 PM Disp. Time Lamount Cohen Time) Disposition Final User 05/25/2021 2:14:55 PM Attempt made - message left [place], RN, Marchelle Folks 05/25/2021  2:28:04 PM SEE PCP WITHIN 3 DAYS Yes Daphine Deutscher, RN, Melanie PLEASE NOTE: All timestamps contained within this report are represented as [place] Standard Time. CONFIDENTIALTY NOTICE: This fax transmission is intended only for the addressee. It contains information that is legally privileged, confidential or otherwise protected from use or disclosure. If you are not the intended recipient, you are strictly prohibited from reviewing, disclosing, copying using or disseminating any of this information or taking any action in reliance on or regarding this information. If you have received this fax in error, please notify us immediately by telephone so that we can arrange for its return to [place]. Phone: [phone], Toll-Free: [phone], Fax: [phone] Page: 2 of 2 Call Id: [id number] Caller Disagree/Comply Comply Caller Understands Yes PreDisposition Call Doctor Care Advice Given Per Guideline SEE PCP WITHIN 3 DAYS: * You need to be seen within 2 or 3 days. REASSURANCE AND EDUCATION - WIDESPREAD RASH: * There are many causes of widespread rashes and most of the time it is not serious. ANTIHISTAMINE MEDICINES - EXTRA NOTES AND WARNINGS: * Antihistamine medicines can be used to treat allergic reactions, allergies, hay fever, hives, and itching. CALL BACK IF: * Fever occurs or severe itching * You become worse CARE ADVICE given per Rash - Widespread and Cause Unknown (Adult) guideline. Referrals REFERRED TO PCP OFFIC

## 2021-05-25 NOTE — Telephone Encounter (Signed)
I spoke with pt; pt is not having difficulty breathing or swelling in mouth,tongue,or throat. Pt scheduled appt with Dr Ermalene Searing 05/26/21 at 10.40 at [facility]. UC & ED precautions given and pt voiced understanding. Sending note to Dr Ermalene Searing and Lupita Leash CMA. This is the only time that was convenient for pt to be seen due to work.

## 2021-05-26 ENCOUNTER — Ambulatory Visit: Payer: Managed Care, Other (non HMO) | Admitting: Family Medicine

## 2021-05-26 NOTE — Telephone Encounter (Signed)
Pt was on your schedule for 05/26/21 at 10:40. I looked at past schedule listing and pt called this morning and cancelled due to tree being down in the road. I spoke with pt and he said he is fine today; pt has to go to work and pt will cb to schedule an appt when he can arrange his work schedule with our appt schedule. UC & ED precautions given again and pt voiced understanding. Pt appreciative of call. Sending note to Dr Ermalene Searing and Dr Reece Agar as PCP.

## 2021-05-26 NOTE — Telephone Encounter (Signed)
This patient is not on my schedule, can yoy make sure an error in scheduling did not happen?

## 2021-10-04 ENCOUNTER — Ambulatory Visit: Payer: Managed Care, Other (non HMO) | Admitting: Family Medicine

## 2021-10-04 ENCOUNTER — Encounter: Payer: Self-pay | Admitting: Family Medicine

## 2021-10-04 VITALS — BP 90/60 | HR 71 | Temp 98.4°F | Ht 69.0 in | Wt 130.0 lb

## 2021-10-04 DIAGNOSIS — J069 Acute upper respiratory infection, unspecified: Secondary | ICD-10-CM | POA: Diagnosis not present

## 2021-10-04 DIAGNOSIS — R0981 Nasal congestion: Secondary | ICD-10-CM | POA: Insufficient documentation

## 2021-10-04 MED ORDER — BENZONATATE 100 MG PO CAPS: 100.0000 mg | ORAL_CAPSULE | Freq: Three times a day (TID) | ORAL | 0 refills | Status: AC | PRN

## 2021-10-04 NOTE — Patient Instructions (Addendum)
You have a viral upper respiratory infection. ?Antibiotics are not needed for this. Viral infections usually take 7-10 days to resolve. The cough can last a few weeks to go away. ?Use medication as prescribed: tessalon perls  ?Push fluids and plenty of rest.  ?Please return if you are not improving as expected, or if you have high fevers (>101.5) or difficulty swallowing or worsening productive cough. ?Call clinic with questions.  Good to see you today. I hope you start feeling better soon.  ?

## 2021-10-04 NOTE — Assessment & Plan Note (Signed)
Anticipate viral URI, reviewed anticipated course of recovery, supportive home measures reviewed.  ?Tessalon perls PRN cough.  ?Update if not improving with treatment.  ?

## 2021-10-04 NOTE — Progress Notes (Signed)
? ? Patient ID: Franklin Curry, male    DOB: [birth year], 31 y.o.   MRN: [id number] ? ?This visit was conducted in person. ? ?BP 90/60   Pulse 71   Temp 98.4 ?F (36.9 ?C) (Temporal)   Ht 5\' 9"  (1.753 m)   Wt 130 lb (59 kg)   SpO2 96%   BMI 19.20 kg/m?   ? ?CC: cough ?Subjective:  ? ?HPI: ?Franklin Curry is a 31 y.o. male presenting on 10/04/2021 for Cough (Dry X 4 days ), Wheezing, and Sore Throat ? ? ?4d h/o cough, congestion, wheezing and ST with initial lost voice. Cough productive of green mucous. Head pressure, maxillary sinus pressure. Head = chest congestion. Stayed home Sunday and Monday. Cough worse at night time. Notes chronic left sided nasal congestion.  ? ?No fevers/chills. No abd pain, no ongoing wheeze, dyspnea, ear or tooth pain.  ? ?Treating with mucinex, last night took melatonin with benefit.  ?Has previously used flonase.  ?No sick contacts at home.  ?No h/o asthma. ?Does have allergies.  ?Non smoker.  ?   ? ?Relevant past medical, surgical, family and social history reviewed and updated as indicated. Interim medical history since our last visit reviewed. ?Allergies and medications reviewed and updated. ?Outpatient Medications Prior to Visit  ?Medication Sig Dispense Refill  ? Pseudoephedrine-guaiFENesin (MUCINEX D PO) Take by mouth as needed.    ? ?No facility-administered medications prior to visit.  ?  ? ?Per HPI unless specifically indicated in ROS section below ?Review of Systems ? ?Objective:  ?BP 90/60   Pulse 71   Temp 98.4 ?F (36.9 ?C) (Temporal)   Ht 5\' 9"  (1.753 m)   Wt 130 lb (59 kg)   SpO2 96%   BMI 19.20 kg/m?   ?Wt Readings from Last 3 Encounters:  ?10/04/21 130 lb (59 kg)  ?03/28/21 129 lb 2 oz (58.6 kg)  ?11/29/20 128 lb (58.1 kg)  ?  ?  ?Physical Exam ?Vitals and nursing note reviewed.  ?Constitutional:   ?   Appearance: Normal appearance. He is not ill-appearing.  ?HENT:  ?   Head: Normocephalic and atraumatic.  ?   Right Ear: Hearing, tympanic membrane, ear canal  and external ear normal. There is no impacted cerumen.  ?   Left Ear: Hearing, tympanic membrane, ear canal and external ear normal. There is no impacted cerumen.  ?   Nose: Congestion present. No mucosal edema or rhinorrhea.  ?   Right Turbinates: Not enlarged or swollen.  ?   Left Turbinates: Not enlarged or swollen.  ?   Right Sinus: No maxillary sinus tenderness or frontal sinus tenderness.  ?   Left Sinus: No maxillary sinus tenderness or frontal sinus tenderness.  ?   Mouth/Throat:  ?   Mouth: Mucous membranes are moist.  ?   Pharynx: Oropharynx is clear. No oropharyngeal exudate or posterior oropharyngeal erythema.  ?Eyes:  ?   Extraocular Movements: Extraocular movements intact.  ?   Conjunctiva/sclera: Conjunctivae normal.  ?   Pupils: Pupils are equal, round, and reactive to light.  ?Cardiovascular:  ?   Rate and Rhythm: Normal rate and regular rhythm.  ?   Pulses: Normal pulses.  ?   Heart sounds: Normal heart sounds. No murmur heard. ?Pulmonary:  ?   Effort: Pulmonary effort is normal. No respiratory distress.  ?   Breath sounds: Normal breath sounds. No wheezing, rhonchi or rales.  ?Musculoskeletal:  ?   Cervical back: Normal range of motion and  neck supple. No rigidity.  ?   Right lower leg: No edema.  ?   Left lower leg: No edema.  ?Lymphadenopathy:  ?   Cervical: No cervical adenopathy.  ?Skin: ?   General: Skin is warm and dry.  ?   Findings: No rash.  ?Neurological:  ?   Mental Status: He is alert.  ?Psychiatric:     ?   Mood and Affect: Mood normal.     ?   Behavior: Behavior normal.  ? ?   ? ?Assessment & Plan:  ? ?Problem List Items Addressed This Visit   ? ? Viral URI with cough - Primary  ?  Anticipate viral URI, reviewed anticipated course of recovery, supportive home measures reviewed.  ?Tessalon perls PRN cough.  ?Update if not improving with treatment.  ? ?  ?  ? Nasal congestion  ?  Chronic left sided. Overall stable exam.  ?Will let me know if ongoing to consider ENT eval. ? ?  ?  ?   ? ?Meds ordered this encounter  ?Medications  ? benzonatate (TESSALON) 100 MG capsule  ?  Sig: Take 1 capsule (100 mg total) by mouth 3 (three) times daily as needed for cough.  ?  Dispense:  30 capsule  ?  Refill:  0  ? ?No orders of the defined types were placed in this encounter. ? ? ? ?Patient Instructions  ?You have a viral upper respiratory infection. ?Antibiotics are not needed for this. Viral infections usually take 7-10 days to resolve. The cough can last a few weeks to go away. ?Use medication as prescribed: tessalon perls  ?Push fluids and plenty of rest.  ?Please return if you are not improving as expected, or if you have high fevers (>101.5) or difficulty swallowing or worsening productive cough. ?Call clinic with questions.  Good to see you today. I hope you start feeling better soon.  ? ?Follow up plan: ?Return if symptoms worsen or fail to improve. ? ?Eustaquio Boyden, MD   ?

## 2021-10-04 NOTE — Assessment & Plan Note (Signed)
Chronic left sided. Overall stable exam.  ?Will let me know if ongoing to consider ENT eval. ?

## 2021-12-02 ENCOUNTER — Encounter: Payer: Self-pay | Admitting: Family Medicine

## 2021-12-02 ENCOUNTER — Telehealth (INDEPENDENT_AMBULATORY_CARE_PROVIDER_SITE_OTHER): Payer: Managed Care, Other (non HMO) | Admitting: Family Medicine

## 2021-12-02 DIAGNOSIS — R197 Diarrhea, unspecified: Secondary | ICD-10-CM | POA: Insufficient documentation

## 2021-12-02 NOTE — Assessment & Plan Note (Addendum)
Anticipate viral enteritis after exposed to sick nephew - supportive measures reviewed. Ok to continue tylenol and imodium. Anticipate not significantly dehydrated as he is making good urine Update if abd pain, persistent fever >101, or blood in stool.  Requests note for work - sent via [employer].

## 2021-12-02 NOTE — Progress Notes (Signed)
Patient ID: Franklin Curry, male    DOB: 1991-01-04, 31 y.o.   MRN: [id number]  Virtual visit completed through MyChart, a video enabled telemedicine application. Due to national recommendations of social distancing due to COVID-19, a virtual visit is felt to be most appropriate for this patient at this time. Reviewed limitations, risks, security and privacy concerns of performing a virtual visit and the availability of in person appointments. I also reviewed that there may be a patient responsible charge related to this service. The patient agreed to proceed.   Patient location: home Provider location: [place] [facility] [place], office Persons participating in this virtual visit: patient, provider   If any vitals were documented, they were collected by patient at home unless specified below.    Temp 99.1 F (37.3 C)   Ht 5\' 9"  (1.753 m)   Wt 130 lb (59 kg)   BMI 19.20 kg/m    CC: diarrhea Subjective:   HPI: Franklin Curry is a 31 y.o. male presenting on 12/02/2021 for Diarrhea (Started  about 3 days  ago, no vomiting,  fever, bodyaches chills, sweating a lot, taking Tylenol dizziness, not able eat much and drinking )   4d h/o diarrhea associated with fever, chills, dizziness, nausea, and body aches. Tmax 102 last night before tylenol. Watery diarrhea 5-6x/day. Making good but dark urine. Left work early yesterday.   No vomiting, abd pain, blood in stool. No respiratory symptoms.   Managing with tylenol and imodium.  He is managing with water and rice and pedialyte.   Started after he was exposed to 3yo nephew with diarrhea and fever - nephew needed to be seen at ER.      Relevant past medical, surgical, family and social history reviewed and updated as indicated. Interim medical history since our last visit reviewed. Allergies and medications reviewed and updated. Outpatient Medications Prior to Visit  Medication Sig Dispense Refill   Acetaminophen (TYLENOL 8 HOUR PO) Take  by mouth.     benzonatate (TESSALON) 100 MG capsule Take 1 capsule (100 mg total) by mouth 3 (three) times daily as needed for cough. (Patient not taking: Reported on 12/02/2021) 30 capsule 0   Pseudoephedrine-guaiFENesin (MUCINEX D PO) Take by mouth as needed. (Patient not taking: Reported on 12/02/2021)     No facility-administered medications prior to visit.     Per HPI unless specifically indicated in ROS section below Review of Systems Objective:  Temp 99.1 F (37.3 C)   Ht 5\' 9"  (1.753 m)   Wt 130 lb (59 kg)   BMI 19.20 kg/m   Wt Readings from Last 3 Encounters:  12/02/21 130 lb (59 kg)  10/04/21 130 lb (59 kg)  03/28/21 129 lb 2 oz (58.6 kg)       Physical exam: Gen: alert, NAD, not ill appearing Pulm: speaks in complete sentences without increased work of breathing Psych: normal mood, normal thought content      Assessment & Plan:   Problem List Items Addressed This Visit     Acute diarrhea    Anticipate viral enteritis after exposed to sick nephew - supportive measures reviewed. Ok to continue tylenol and imodium. Anticipate not significantly dehydrated as he is making good urine Update if abd pain, persistent fever >101, or blood in stool.  Requests note for work - sent via 12/04/21.         No orders of the defined types were placed in this encounter.  No orders of the defined types  were placed in this encounter.   I discussed the assessment and treatment plan with the patient. The patient was provided an opportunity to ask questions and all were answered. The patient agreed with the plan and demonstrated an understanding of the instructions. The patient was advised to call back or seek an in-person evaluation if the symptoms worsen or if the condition fails to improve as anticipated.  Follow up plan: Return if symptoms worsen or fail to improve.  Eustaquio Boyden, MD
